# Patient Record
Sex: Male | Born: 2000
Health system: Southern US, Community
[De-identification: ages and names within clinical notes are randomized; demographics above are authoritative.]

---
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
## 2020-01-02 DIAGNOSIS — Z03818 Encounter for observation for suspected exposure to other biological agents ruled out: Secondary | ICD-10-CM | POA: Diagnosis not present

## 2020-01-09 DIAGNOSIS — Z03818 Encounter for observation for suspected exposure to other biological agents ruled out: Secondary | ICD-10-CM | POA: Diagnosis not present

## 2020-03-10 ENCOUNTER — Other Ambulatory Visit (HOSPITAL_COMMUNITY): Payer: Self-pay | Admitting: Dermatology

## 2020-03-10 MED FILL — MOMETASONE FUROATE 0.1% OIN: 0.1 | 30 days supply | Qty: 60 | Fill #0

## 2020-04-21 DIAGNOSIS — Z7182 Exercise counseling: Secondary | ICD-10-CM | POA: Diagnosis not present

## 2020-04-21 DIAGNOSIS — Z68.41 Body mass index (BMI) pediatric, 5th percentile to less than 85th percentile for age: Secondary | ICD-10-CM | POA: Diagnosis not present

## 2020-04-21 DIAGNOSIS — Z0000 Encounter for general adult medical examination without abnormal findings: Secondary | ICD-10-CM | POA: Diagnosis not present

## 2020-04-21 DIAGNOSIS — Z713 Dietary counseling and surveillance: Secondary | ICD-10-CM | POA: Diagnosis not present

## 2020-04-21 DIAGNOSIS — Z23 Encounter for immunization: Secondary | ICD-10-CM | POA: Diagnosis not present

## 2020-04-21 DIAGNOSIS — Z113 Encounter for screening for infections with a predominantly sexual mode of transmission: Secondary | ICD-10-CM | POA: Diagnosis not present

## 2020-08-13 MED FILL — MOMETASONE FUROATE 0.1% OIN: 0.1 | 30 days supply | Qty: 60 | Fill #0

## 2020-08-31 DIAGNOSIS — Z03818 Encounter for observation for suspected exposure to other biological agents ruled out: Secondary | ICD-10-CM | POA: Diagnosis not present

## 2020-10-24 ENCOUNTER — Other Ambulatory Visit: Payer: Self-pay

## 2020-10-24 DIAGNOSIS — Z20822 Contact with and (suspected) exposure to covid-19: Secondary | ICD-10-CM

## 2020-10-29 LAB — NOVEL CORONAVIRUS, NAA

## 2021-09-16 ENCOUNTER — Ambulatory Visit (INDEPENDENT_AMBULATORY_CARE_PROVIDER_SITE_OTHER): Payer: Self-pay

## 2021-09-16 ENCOUNTER — Other Ambulatory Visit: Payer: Self-pay

## 2021-09-16 ENCOUNTER — Ambulatory Visit (INDEPENDENT_AMBULATORY_CARE_PROVIDER_SITE_OTHER): Payer: 59 | Admitting: Podiatry

## 2021-09-16 ENCOUNTER — Encounter: Payer: Self-pay | Admitting: Podiatry

## 2021-09-16 DIAGNOSIS — M7661 Achilles tendinitis, right leg: Secondary | ICD-10-CM

## 2021-09-16 DIAGNOSIS — S99921A Unspecified injury of right foot, initial encounter: Secondary | ICD-10-CM | POA: Diagnosis not present

## 2021-09-16 MED ORDER — MELOXICAM 15 MG PO TABS: 15.0000 mg | ORAL_TABLET | Freq: Every day | ORAL | 0 refills | Status: AC

## 2021-09-16 NOTE — Progress Notes (Signed)
  Subjective:  Patient ID: Douglas Thompson, male    DOB: [birth year],   MRN: [id number]  No chief complaint on file.   20 y.o. male presents for right heel pain that started about a month ago. Was playing in a football game. 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 Relates he didn't have a specific injury but started hurting after the game. Relates it is radiating to his toes. Has been stretching it with the trainer but continued to have pain . Denies any other pedal complaints. Denies n/v/f/c.   No past medical history on file.  Objective:  Physical Exam: Vascular: DP/PT pulses 2/4 bilateral. CFT <3 seconds. Normal hair growth on digits. No edema.  Skin. No lacerations or abrasions bilateral feet.   Musculoskeletal: MMT 5/5 bilateral lower extremities in DF, PF, Inversion and Eversion. Deceased ROM in DF of ankle joint. Tender to achilles insertion on the right. No tenderness proximally along the tendon. Pain with plantarflexion and dorsiflexion.  Neurological: Sensation intact to light touch.   Assessment:   1. Pain in right foot      Plan:  Patient was evaluated and treated and all questions answered. -Xrays reviewed. No acute fractures dislocations or spurring  -Discussed Achilles insertional tendonitis and treatment options with patient.  -Discussed stretching exercises. -Rx Meloxicam provided  -Heel lifts provided and discussed proper shoewear.  -Discussed if no improvement will consider MRI/PT/EPAT/PRP injections.  -Patient to return to office as needed or sooner if condition worsens.   Lorenda Peck, DPM

## 2021-09-16 NOTE — Patient Instructions (Signed)

## 2021-10-29 ENCOUNTER — Other Ambulatory Visit: Payer: Self-pay

## 2021-10-29 ENCOUNTER — Ambulatory Visit (INDEPENDENT_AMBULATORY_CARE_PROVIDER_SITE_OTHER): Payer: 59 | Admitting: Podiatry

## 2021-10-29 ENCOUNTER — Encounter: Payer: Self-pay | Admitting: Podiatry

## 2021-10-29 ENCOUNTER — Ambulatory Visit: Payer: 59 | Admitting: Podiatry

## 2021-10-29 DIAGNOSIS — M7661 Achilles tendinitis, right leg: Secondary | ICD-10-CM | POA: Diagnosis not present

## 2021-10-29 NOTE — Progress Notes (Signed)
°  Subjective:  Patient ID: Douglas Thompson, male    DOB: 09-Aug-2001,   MRN: [id number]  Chief Complaint  Patient presents with   Foot Problem    Pt is here to f/u right heel pain . Mention that heel feels much better      21 y.o. male presents for follow-up of right achilles tendon pain. States he is doing about 1 -90% better with his pain. Relates he just got back to school and will be starting workouts soon.  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  Denies any other pedal complaints. Denies n/v/f/c.   History reviewed. No pertinent past medical history.  Objective:  Physical Exam: Vascular: DP/PT pulses 2/4 bilateral. CFT <3 seconds. Normal hair growth on digits. No edema.  Skin. No lacerations or abrasions bilateral feet.   Musculoskeletal: MMT 5/5 bilateral lower extremities in DF, PF, Inversion and Eversion. Deceased ROM in DF of ankle joint. Tender to achilles insertion on the right. No tenderness proximally along the tendon. Pain with plantarflexion and dorsiflexion.  Neurological: Sensation intact to light touch.   Assessment:   1. Tendonitis, Achilles, right       Plan:  Patient was evaluated and treated and all questions answered. -Xrays reviewed. No acute fractures dislocations or spurring  -Discussed Achilles insertional tendonitis and treatment options with patient.  -Continue stretching exercises especially before workouts.  -Rx Meloxicam provided as needed.  -Heel lifts as needed -Patient to return to office as needed or sooner if condition worsens.   Lorenda Peck, DPM

## 2022-03-17 DIAGNOSIS — M545 Low back pain, unspecified: Secondary | ICD-10-CM | POA: Diagnosis not present

## 2022-03-22 DIAGNOSIS — M545 Low back pain, unspecified: Secondary | ICD-10-CM | POA: Diagnosis not present

## 2022-03-24 DIAGNOSIS — M545 Low back pain, unspecified: Secondary | ICD-10-CM | POA: Diagnosis not present

## 2022-03-28 DIAGNOSIS — M545 Low back pain, unspecified: Secondary | ICD-10-CM | POA: Diagnosis not present

## 2022-04-12 DIAGNOSIS — M545 Low back pain, unspecified: Secondary | ICD-10-CM | POA: Diagnosis not present

## 2022-04-25 DIAGNOSIS — M5459 Other low back pain: Secondary | ICD-10-CM | POA: Diagnosis not present

## 2022-05-26 DIAGNOSIS — S83282A Other tear of lateral meniscus, current injury, left knee, initial encounter: Secondary | ICD-10-CM | POA: Diagnosis not present

## 2022-05-26 DIAGNOSIS — M25562 Pain in left knee: Secondary | ICD-10-CM | POA: Diagnosis not present

## 2023-02-10 IMAGING — DX DG FOOT COMPLETE 3+V*R*
3 series · 3 of 3 positions shown · non-contrast
Comparison: None.

CLINICAL DATA: Foot injury 1 month ago while playing football,
initial encounter

EXAM:
RIGHT FOOT COMPLETE - 3+ VIEW

[foot ap]
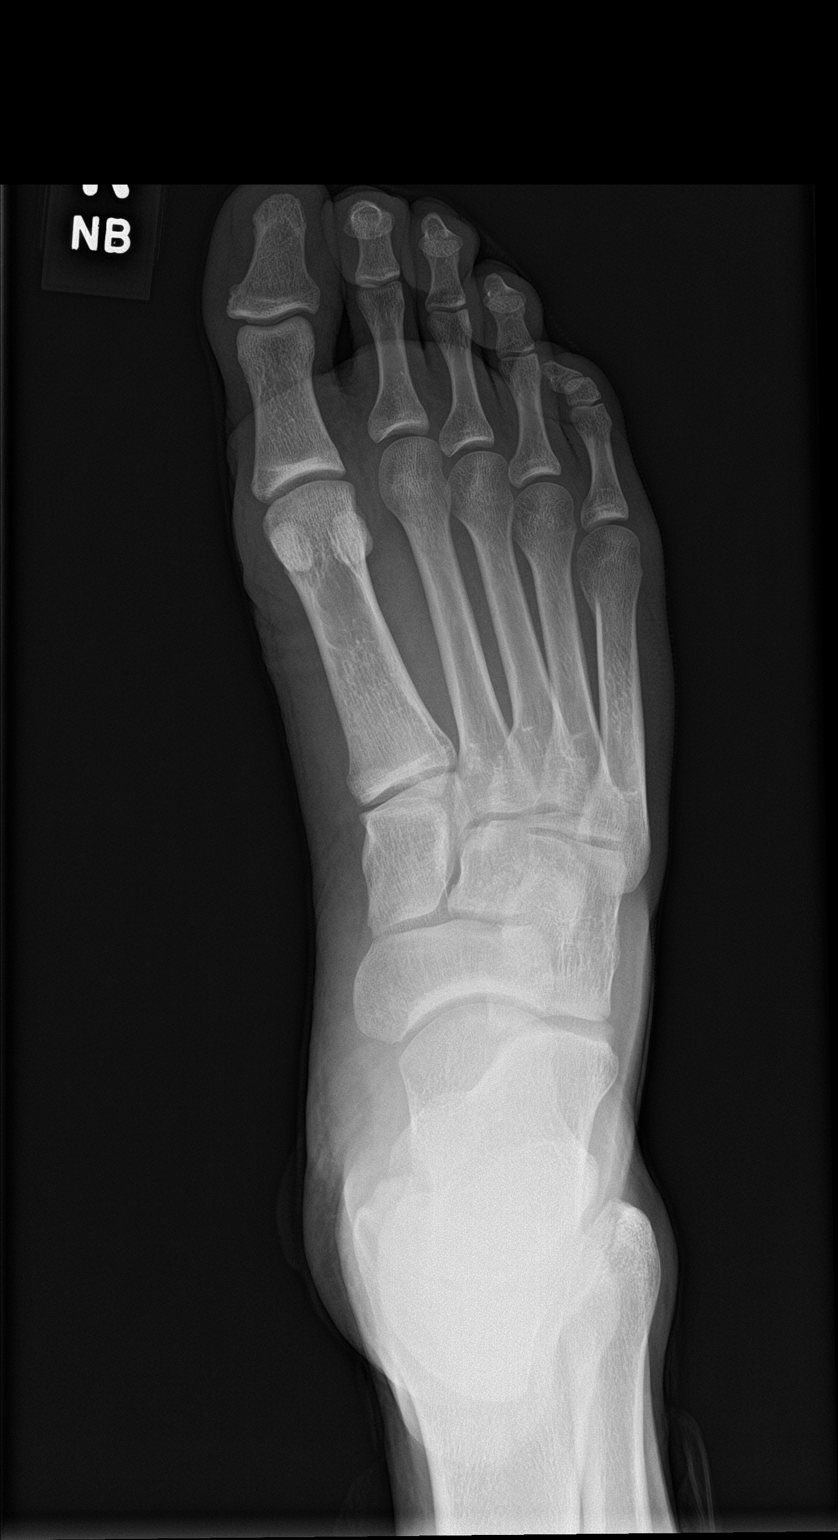

[foot obl]
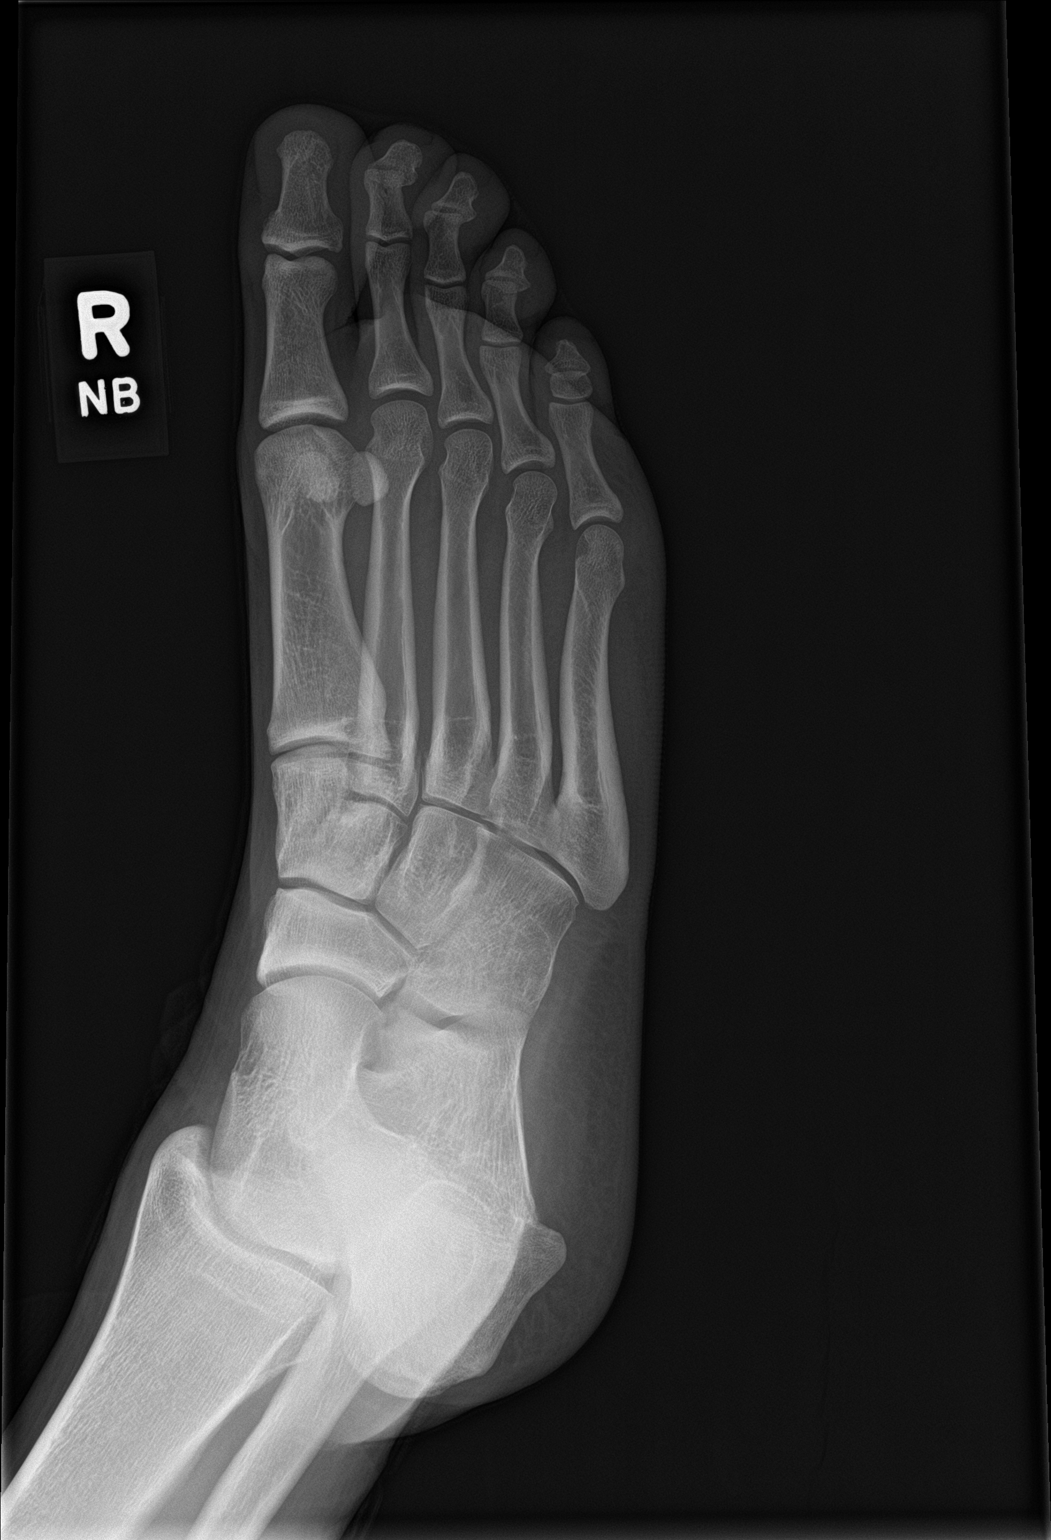

[foot lat]
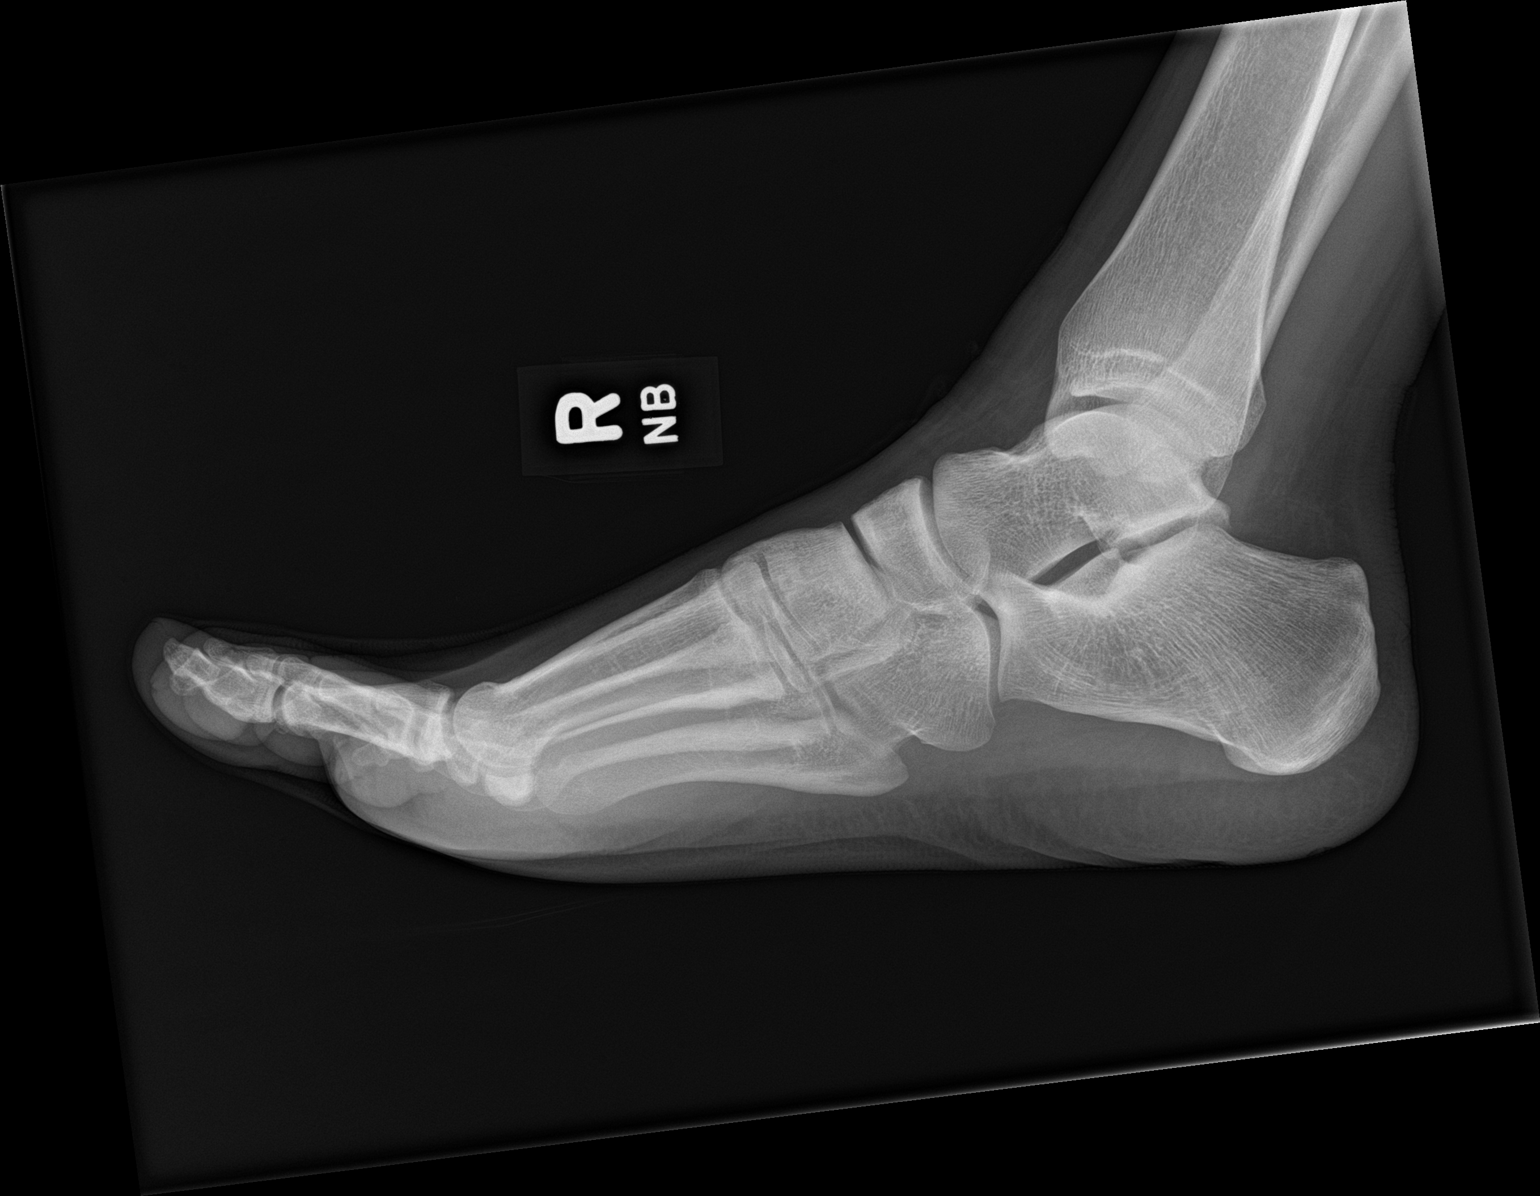

[3 of 3 positions shown; findings below may reference images not displayed]

FINDINGS: There is no evidence of fracture or dislocation. There is no
evidence of arthropathy or other focal bone abnormality. Soft
tissues are unremarkable.
IMPRESSION: No acute abnormality noted.

## 2023-03-10 ENCOUNTER — Ambulatory Visit (INDEPENDENT_AMBULATORY_CARE_PROVIDER_SITE_OTHER): Payer: Commercial Managed Care - PPO

## 2023-03-10 ENCOUNTER — Ambulatory Visit (HOSPITAL_BASED_OUTPATIENT_CLINIC_OR_DEPARTMENT_OTHER): Payer: Commercial Managed Care - PPO | Admitting: Orthopaedic Surgery

## 2023-03-10 DIAGNOSIS — M25511 Pain in right shoulder: Secondary | ICD-10-CM

## 2023-03-10 DIAGNOSIS — S43431A Superior glenoid labrum lesion of right shoulder, initial encounter: Secondary | ICD-10-CM

## 2023-03-10 NOTE — Progress Notes (Signed)
Chief Complaint: Right shoulder pain     History of Present Illness:    Douglas Thompson is a 22 y.o. male with right shoulder pain after a football injury where he went low to tackle the patient and subsequently felt pain in the right shoulder.  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  Since that time he has had a hard time with overhead activities or with benchpress.  He is persistently experiencing pain in the posterior aspect of the shoulder.  Has not had any injections or physical therapy    Surgical History:   None  PMH/PSH/Family History/Social History/Meds/Allergies:   No past medical history on file. No past surgical history on file. Social History   Socioeconomic History   Marital status: Single    Spouse name: Not on file   Number of children: Not on file   Years of education: Not on file   Highest education level: Not on file  Occupational History   Not on file  Tobacco Use   Smoking status: Not on file   Smokeless tobacco: Not on file  Substance and Sexual Activity   Alcohol use: Not on file   Drug use: Not on file   Sexual activity: Not on file  Other Topics Concern   Not on file  Social History Narrative   Not on file   Social Determinants of Health   Financial Resource Strain: Not on file  Food Insecurity: Not on file  Transportation Needs: Not on file  Physical Activity: Not on file  Stress: Not on file  Social Connections: Not on file   No family history on file. Allergies  Allergen Reactions   Amoxicillin Other (See Comments)   Current Outpatient Medications  Medication Sig Dispense Refill   meloxicam (MOBIC) 15 MG tablet Take 1 tablet (15 mg total) by mouth daily. 30 tablet 0   No current facility-administered medications for this visit.   No results found.  Review of Systems:   A ROS was performed including pertinent positives and negatives as documented in the  HPI.  Physical Exam :   Constitutional: NAD and appears stated age Neurological: Alert and oriented Psych: Appropriate affect and cooperative There were no vitals taken for this visit.   Comprehensive Musculoskeletal Exam:    Musculoskeletal Exam    Inspection Right Left  Skin No atrophy or winging No atrophy or winging  Palpation    Tenderness Posterior GH noe  Range of Motion    Flexion (passive) 170 170  Flexion (active) 170 170  Abduction 170 170  ER at the side 70 70  Can reach behind back to T12 T12  Strength     5/5 5/5  Special Tests    Pseudoparalytic No No  Neurologic    Fires PIN, radial, median, ulnar, musculocutaneous, axillary, suprascapular, long thoracic, and spinal accessory innervated muscles. No abnormal sensibility  Vascular/Lymphatic    Radial Pulse 2+ 2+  Cervical Exam    Patient has symmetric cervical range of motion with negative Spurling's test.  Special Test: Positive jerk maneuver     Imaging:   Xray (3 views right shoulder): Normal    I personally reviewed and interpreted the radiographs.   Assessment:   22 y.o. male with evidence of right  shoulder glenohumeral posterior instability after football injury.  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  I did discuss that I would like to get him going with physical therapy and rehab to strengthen up his posterior chain in his posterior rotator cuff musculature in an effort to help stabilize this injury.  I will plan to begin this and we will see him back following the MRI to discuss results  Plan :    -Plan for MRI right shoulder and follow-up discuss results     I personally saw and evaluated the patient, and participated in the management and treatment plan.  Huel Cote, MD Attending Physician, Orthopedic Surgery  This document was dictated using Dragon voice  recognition software. A reasonable attempt at proof reading has been made to minimize errors.

## 2023-03-23 ENCOUNTER — Ambulatory Visit (HOSPITAL_BASED_OUTPATIENT_CLINIC_OR_DEPARTMENT_OTHER): Payer: Commercial Managed Care - PPO | Admitting: Orthopaedic Surgery

## 2023-03-24 ENCOUNTER — Ambulatory Visit (HOSPITAL_COMMUNITY)
Admission: RE | Admit: 2023-03-24 | Discharge: 2023-03-24 | Disposition: A | Discharge status: Home / Self Care | Payer: Commercial Managed Care - PPO | Source: Ambulatory Visit | Attending: Orthopaedic Surgery | Admitting: Orthopaedic Surgery

## 2023-03-24 DIAGNOSIS — S43431A Superior glenoid labrum lesion of right shoulder, initial encounter: Secondary | ICD-10-CM

## 2023-03-24 DIAGNOSIS — M25511 Pain in right shoulder: Secondary | ICD-10-CM | POA: Insufficient documentation

## 2023-03-24 DIAGNOSIS — Y9361 Activity, american tackle football: Secondary | ICD-10-CM | POA: Diagnosis not present

## 2023-03-24 MED ORDER — IOHEXOL 180 MG/ML  SOLN
10.0000 mL | Freq: Once | INTRAMUSCULAR | Status: AC | PRN
  Administered 2023-03-24: 10 mL via INTRAVENOUS

## 2023-03-24 MED ORDER — GADOBUTROL 1 MMOL/ML IV SOLN
1.0000 mL | Freq: Once | INTRAVENOUS | Status: AC | PRN
  Administered 2023-03-24: 1 mL

## 2023-03-24 MED ORDER — LIDOCAINE HCL (PF) 1 % IJ SOLN
5.0000 mL | Freq: Once | INTRAMUSCULAR | Status: AC
  Administered 2023-03-24: 5 mL via INTRADERMAL

## 2023-03-24 MED ORDER — SODIUM CHLORIDE (PF) 0.9 % IJ SOLN: 10.0000 mL | INTRAMUSCULAR | Status: DC | PRN

## 2023-03-24 NOTE — Procedures (Signed)
PROCEDURE SUMMARY:  Successful fluoroscopic guided right shoulder arthrogram.  No immediate complications.  Pt tolerated well.   EBL = <1 ml  Please see full dictation in imaging section of Epic for procedure details.   Alex Gardener, AGNP-BC 03/24/2023, 11:42 AM

## 2023-03-29 ENCOUNTER — Ambulatory Visit (HOSPITAL_BASED_OUTPATIENT_CLINIC_OR_DEPARTMENT_OTHER): Payer: Commercial Managed Care - PPO | Admitting: Orthopaedic Surgery

## 2023-04-05 ENCOUNTER — Ambulatory Visit (HOSPITAL_BASED_OUTPATIENT_CLINIC_OR_DEPARTMENT_OTHER): Payer: Commercial Managed Care - PPO | Admitting: Orthopaedic Surgery

## 2023-04-05 DIAGNOSIS — S43431A Superior glenoid labrum lesion of right shoulder, initial encounter: Secondary | ICD-10-CM | POA: Diagnosis not present

## 2023-04-05 DIAGNOSIS — M25511 Pain in right shoulder: Secondary | ICD-10-CM | POA: Diagnosis not present

## 2023-04-05 NOTE — Progress Notes (Signed)
Chief Complaint: Right shoulder pain     History of Present Illness:   04/05/2023: Presents today for follow-up of his right shoulder. He is here for MRI review.  Denies that the shoulder is giving out deep joint based pain that is 3-4/10  Douglas Thompson is a 22 y.o. male with right shoulder pain after a football injury where he went low to tackle the patient and subsequently felt pain in the right shoulder.  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  Since that time he has had a hard time with overhead activities or with benchpress.  He is persistently experiencing pain in the posterior aspect of the shoulder.  Has not had any injections or physical therapy    Surgical History:   None  PMH/PSH/Family History/Social History/Meds/Allergies:   No past medical history on file. No past surgical history on file. Social History   Socioeconomic History   Marital status: Single    Spouse name: Not on file   Number of children: Not on file   Years of education: Not on file   Highest education level: Not on file  Occupational History   Not on file  Tobacco Use   Smoking status: Not on file   Smokeless tobacco: Not on file  Substance and Sexual Activity   Alcohol use: Not on file   Drug use: Not on file   Sexual activity: Not on file  Other Topics Concern   Not on file  Social History Narrative   Not on file   Social Determinants of Health   Financial Resource Strain: Not on file  Food Insecurity: Not on file  Transportation Needs: Not on file  Physical Activity: Not on file  Stress: Not on file  Social Connections: Not on file   No family history on file. Allergies  Allergen Reactions   Amoxicillin Other (See Comments)   Current Outpatient Medications  Medication Sig Dispense Refill   meloxicam (MOBIC) 15 MG tablet Take 1 tablet (15 mg total) by mouth daily. 30 tablet 0   No current  facility-administered medications for this visit.   No results found.  Review of Systems:   A ROS was performed including pertinent positives and negatives as documented in the HPI.  Physical Exam :   Constitutional: NAD and appears stated age Neurological: Alert and oriented Psych: Appropriate affect and cooperative There were no vitals taken for this visit.   Comprehensive Musculoskeletal Exam:    Musculoskeletal Exam    Inspection Right Left  Skin No atrophy or winging No atrophy or winging  Palpation    Tenderness Posterior GH noe  Range of Motion    Flexion (passive) 170 170  Flexion (active) 170 170  Abduction 170 170  ER at the side 70 70  Can reach behind back to T12 T12  Strength     5/5 5/5  Special Tests    Pseudoparalytic No No  Neurologic    Fires PIN, radial, median, ulnar, musculocutaneous, axillary, suprascapular, long thoracic, and spinal accessory innervated muscles. No abnormal sensibility  Vascular/Lymphatic    Radial Pulse 2+ 2+  Cervical Exam    Patient has symmetric cervical range of motion with negative Spurling's test.  Special Test: Positive jerk maneuver     Imaging:  Xray (3 views right shoulder): Normal  MRI right shoulder: Status post inferior labral lesion with loose body in the axillary pouch  I personally reviewed and interpreted the radiographs.   Assessment:   22 y.o. male with evidence of right shoulder glenohumeral posterior instability after football injury.  Overall I discussed that his MRI is consistent with a posterior inferior labral tear.  Given the fact that he is approaching the beginning of the season he is quite concerned about any type of intervention as I do believe that this would ultimately delay his season.  We did discuss the return to play timeline of a minimum of 4 months.  Given this he would like to delay any type of intervention.  I will plan to follow back up with him in mid to late September.  In the  meantime we will plan to get physical therapy going for a posterior rotator cuff strengthening program to assist with his posterior instability  Plan :    -Plan physical therapy for posterior chain strengthening of the shoulder     I personally saw and evaluated the patient, and participated in the management and treatment plan.  Huel Cote, MD Attending Physician, Orthopedic Surgery  This document was dictated using Dragon voice recognition software. A reasonable attempt at proof reading has been made to minimize errors.

## 2023-04-07 ENCOUNTER — Ambulatory Visit (HOSPITAL_BASED_OUTPATIENT_CLINIC_OR_DEPARTMENT_OTHER): Payer: Commercial Managed Care - PPO | Attending: Orthopaedic Surgery | Admitting: Physical Therapy

## 2023-04-07 ENCOUNTER — Other Ambulatory Visit: Payer: Self-pay

## 2023-04-07 ENCOUNTER — Encounter (HOSPITAL_BASED_OUTPATIENT_CLINIC_OR_DEPARTMENT_OTHER): Payer: Self-pay | Admitting: Physical Therapy

## 2023-04-07 DIAGNOSIS — M25511 Pain in right shoulder: Secondary | ICD-10-CM | POA: Insufficient documentation

## 2023-04-07 DIAGNOSIS — M6281 Muscle weakness (generalized): Secondary | ICD-10-CM | POA: Diagnosis not present

## 2023-04-07 DIAGNOSIS — S43431A Superior glenoid labrum lesion of right shoulder, initial encounter: Secondary | ICD-10-CM | POA: Diagnosis not present

## 2023-04-07 NOTE — Therapy (Signed)
OUTPATIENT PHYSICAL THERAPY UPPER EXTREMITY EVALUATION   Patient Name: Douglas Thompson MRN: [id number] DOB:11-12-2000, 22 y.o., male Today's Date: 04/07/2023  END OF SESSION:  PT End of Session - 04/07/23 1155     Visit Number 1    Number of Visits 21    Date for PT Re-Evaluation 07/06/23    Authorization Type [facility]    PT Start Time [phone]    PT Stop Time 0930    PT Time Calculation (min) 45 min    Activity Tolerance Patient tolerated treatment well    Behavior During Therapy [facility] for tasks assessed/performed             History reviewed. No pertinent past medical history. History reviewed. No pertinent surgical history. There are no problems to display for this patient.   PCP: N/A  REFERRING PROVIDER: Huel Cote, MD   REFERRING DIAG:  [phone] (ICD-10-CM) - Tear of right glenoid labrum, initial encounter      THERAPY DIAG:  Muscle weakness (generalized)  Right shoulder pain, unspecified chronicity  Rationale for Evaluation and Treatment: Rehabilitation  ONSET DATE: May 2024  SUBJECTIVE:                                                                                                                                                                                      SUBJECTIVE STATEMENT:  Injury occurred during a spring scrimmage. Pt states that the right shoulder pain started after a football injury after making a tackle. Shoulder was abducted and dipped fwd into IR.  Pt states it felt loose after after he realxed and showered that night. Could not sleep on it due to sharp pain inside the joint. 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  Since that time pt has pain with overhead activities and benchpress. Benching felt shakey and weak but no longer. Does not feel unstable. Will get aggravated with throwing but then pain will settle down after an hour. Can do OHP but has mild pain after. Denies popping clicking. Pt denies NT. Pt can touch the pain  superficially. Reaching across the chest feels stiff but no stiffness otherwise going OH.    Pt is currently out for the summer and will return to training camp starting in August 3rd.    Prior to injury: 210-215 bench for 5x5; 185-200 (has not tried bench)  Hand dominance: Right  PERTINENT HISTORY: N/A   PAIN:  Are you having pain? Yes: NPRS scale: 0/10; 5/10 Pain location: posterior hsoulder  Pain description: dull, achiness Aggravating factors: IR/ER, bench, throwing, benching, reaching backwards, sleeping on it  Relieving factors: resting by the side, ice/heat, ibuprofen   PRECAUTIONS:  Shoulder  WEIGHT BEARING RESTRICTIONS: No  FALLS:  Has patient fallen in last 6 months? No  LIVING ENVIRONMENT: Lives with: lives with their family Lives in: House/apartment  OCCUPATION: Engineer, site; Land  PLOF: Independent  PATIENT GOALS: return to football    OBJECTIVE:   DIAGNOSTIC FINDINGS:  IMPRESSION: 1. 0.8 by 0.4 by 1.1 cm filling defect posteriorly in the glenohumeral joint, suspicious for a free chondral fragment. Focal chondral thinning and irregularity in the anterior and anterior inferior glenoid is suspicious for potential donor site. 2. Questionable small GLAD tear on ABER image 14 series 12 immediately adjacent to the chondral defect. 3. Mesoacromial os acromiale  PATIENT SURVEYS :  FOTO 72 82 @ DC 2 pts MCII  COGNITION: Overall cognitive status: Within functional limits for tasks assessed     SENSATION: WFL  POSTURE: Rounded shoulders, elevated R in standing  UPPER EXTREMITY ROM: Full ROM of bilateral UE, painful 90/90 IR on R- into infra/posterior cuff  UPPER EXTREMITY MMT:  MMT Right eval Left eval  Shoulder flexion 66.4 60.1  Shoulder extension 62.7 69.9  Shoulder abduction 57.1 61.3  Shoulder adduction    Shoulder internal rotation 33.1 31.7  Shoulder external rotation 23.9 (90/090) 41.6 (90/090)  (Blank rows = not  tested)  SHOULDER SPECIAL TESTS: Impingement tests: Hawkins/Kennedy impingement test: negative and Painful arc test: negative SLAP lesions: Biceps load test: negative Instability tests: Load and shift test: negative Rotator cuff assessment: Empty can test: negative, Full can test: negative, External rotation lag sign: negative, and Belly press test: negative   JOINT MOBILITY TESTING:  Expected stiffness into posterior glide of R shoulder; globally stiff through both GHJ  PALPATION:  TTP of R posterior cuff, especially infra   TODAY'S TREATMENT:                                                                                                                                         DATE: 6/21   Program Notes Tennis self release ( flexion and IR/ER) 10x with  Exercises - Full Plank with Scapular Protraction Retraction AROM  - 1 x daily - 3 x weekly - 3 sets - 10 reps - Kettlebell Drag  - 1 x daily - 3 x weekly - 3 sets - 12 reps - Kettlebell Bottom Up Carry  - 1 x daily - 3 x weekly - 1 sets - 3 reps - 7ft hold  PATIENT EDUCATION: Education details: MOI, diagnosis, prognosis, anatomy, exercise progression, DOMS expectations, muscle firing,  envelope of function, HEP, POC  Person educated: Patient Education method: Explanation, Demonstration, Tactile cues, Verbal cues, and Handouts Education comprehension: verbalized understanding, returned demonstration, verbal cues required, tactile cues required, and needs further education  HOME EXERCISE PROGRAM:  Access Code: [email or web address] URL: [email or web address] Date: 04/07/2023 Prepared by: Zebedee Iba  ASSESSMENT:  CLINICAL IMPRESSION:  Patient is a 22 y.o. male who was  seen today for physical therapy evaluation and treatment for c/c of R shoulder pain. Pt's s/s appear consistent with R posterior shoulder muscular injury and soft tissue restriction due to known traumatic MOI. Imaging does show GLAD tear and chondral defect of  the R GHJ. However, pt's pain is minimally sensitive and irritable with movement. Pt likely to respond well to focus on stability and strength based loading of R posterior shoulder.  Plan to continue with R posterior shoulder pain management modalities PRN and focused posterior shoulder and mid back strengthening at future sessions. Pt would benefit from continued skilled therapy in order to reach goals and maximize functional R UE strength and ROM for full return to PLOF as a Land.      OBJECTIVE IMPAIRMENTS: decreased strength, hypomobility, increased muscle spasms, impaired flexibility, impaired UE functional use, improper body mechanics, postural dysfunction, and pain.    ACTIVITY LIMITATIONS: carrying, lifting, dressing, reach over head, and hygiene/grooming   PARTICIPATION LIMITATIONS: cleaning, laundry, interpersonal relationship, shopping, community activity, occupation, yard work, and exercise   PERSONAL FACTORS: Fitness, Time since onset of injury/illness/exacerbation, are also affecting patient's functional outcome.    REHAB POTENTIAL: Good   CLINICAL DECISION MAKING: Stable/uncomplicated   EVALUATION COMPLEXITY: Low   GOALS:  SHORT TERM GOALS: Target date: 05/19/2023    Will be compliant with appropriate progressive HEP  Baseline: Goal status: INITIAL   2.  Pt will be able to demonstrate ability to perform full OH and rotational AROM without pain in order to demonstrate functional improvement in UE function for self-care and house hold duties.  Baseline:  Goal status: INITIAL   3.  Pt will score at least 2 pt increase on FOTO to demonstrate functional improvement in MCII and pt perceived function.   Baseline:  Goal status: INITIAL        LONG TERM GOALS: Target date: 06/30/2023     Pt  will become independent with final HEP in order to demonstrate synthesis of PT education.    Goal status: INITIAL   2.  Pt will be able to demonstrate symmetrical HHD  testing of R vs L shoulder strength in order to demonstrate functional improvement in LE function for return to normal exercise and student athlete demands.      Goal status: INITIAL   3.  Pt will be able to demonstrate/report continuous throwing during practice without pain in order to demonstrate functional improvement in UE function for return to team play/practice.     Goal status: INITIAL   4.  Pt will be able to demonstrate ability to push/pull/press with the R UE without pain in order to demonstrate functional improvement in UE function for return to exercise.  Goal status: INITIAL     PLAN: PT FREQUENCY: 1x/week   PT DURATION: 12 weeks    PLANNED INTERVENTIONS: Therapeutic exercises, Therapeutic activity, Neuromuscular re-education, Balance training, Gait training, Patient/Family education, Self Care, Joint mobilization, Joint manipulation, Aquatic Therapy, Dry Needling, Electrical stimulation, Wheelchair mobility training, Spinal manipulation, Spinal mobilization, Moist heat, scar mobilization, Splintting, Taping, Vasopneumatic device, Traction, Ultrasound, Ionotophoresis 4mg /ml Dexamethasone, Manual therapy, and Re-evaluation   PLAN FOR NEXT SESSION: R shoulder strength, scapular stability, TPDN and manual for R posterior shoulder, CKC loaded stability, bench and OHP technique checks       Zebedee Iba, PT 04/07/2023, 12:15 PM

## 2023-04-10 ENCOUNTER — Ambulatory Visit (HOSPITAL_BASED_OUTPATIENT_CLINIC_OR_DEPARTMENT_OTHER): Payer: Commercial Managed Care - PPO | Admitting: Physical Therapy

## 2023-04-10 ENCOUNTER — Telehealth (HOSPITAL_BASED_OUTPATIENT_CLINIC_OR_DEPARTMENT_OTHER): Payer: Self-pay | Admitting: Physical Therapy

## 2023-04-10 NOTE — Telephone Encounter (Signed)
Left VM. Called pt in regard to 1st N/S visit and to refer to attendance policy. Informed of next scheduled appt.   Zebedee Iba PT, DPT 04/10/23 8:27 AM

## 2023-04-18 ENCOUNTER — Ambulatory Visit (HOSPITAL_BASED_OUTPATIENT_CLINIC_OR_DEPARTMENT_OTHER): Payer: Commercial Managed Care - PPO | Attending: Orthopaedic Surgery | Admitting: Physical Therapy

## 2023-04-18 ENCOUNTER — Encounter (HOSPITAL_BASED_OUTPATIENT_CLINIC_OR_DEPARTMENT_OTHER): Payer: Self-pay | Admitting: Physical Therapy

## 2023-04-18 DIAGNOSIS — M25511 Pain in right shoulder: Secondary | ICD-10-CM | POA: Insufficient documentation

## 2023-04-18 DIAGNOSIS — M6281 Muscle weakness (generalized): Secondary | ICD-10-CM | POA: Insufficient documentation

## 2023-04-18 NOTE — Therapy (Signed)
OUTPATIENT PHYSICAL THERAPY UPPER EXTREMITY TREATMENT   Patient Name: Douglas Thompson MRN: [id number] DOB:January 29, 2001, 22 y.o., male Today's Date: 04/18/2023  END OF SESSION:  PT End of Session - 04/18/23 1612     Visit Number 2    Number of Visits 21    Date for PT Re-Evaluation 07/06/23    Authorization Type [facility]    PT Start Time 1615    PT Stop Time 1655    PT Time Calculation (min) 40 min    Activity Tolerance Patient tolerated treatment well    Behavior During Therapy [facility] for tasks assessed/performed             History reviewed. No pertinent past medical history. History reviewed. No pertinent surgical history. There are no problems to display for this patient.   PCP: N/A  REFERRING PROVIDER: Huel Cote, MD   REFERRING DIAG:  [phone] (ICD-10-CM) - Tear of right glenoid labrum, initial encounter      THERAPY DIAG:  Right shoulder pain, unspecified chronicity  Muscle weakness (generalized)  Rationale for Evaluation and Treatment: Rehabilitation  ONSET DATE: May 2024  SUBJECTIVE:                                                                                                                                                                                      SUBJECTIVE STATEMENT:  Pt reports that the tennis ball has helped reduced tenderness across the back of the shoulder. He was able to workout yesterday with the team with split jerks, bench press, and shrugs without increase in pain.   Eval:   Injury occurred during a spring scrimmage. Pt states that the right shoulder pain started after a football injury after making a tackle. Shoulder was abducted and dipped fwd into IR.  Pt states it felt loose after after he realxed and showered that night. Could not sleep on it due to sharp pain inside the joint. 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  Since that time pt has pain with overhead activities and benchpress. Benching felt shakey and weak but  no longer. Does not feel unstable. Will get aggravated with throwing but then pain will settle down after an hour. Can do OHP but has mild pain after. Denies popping clicking. Pt denies NT. Pt can touch the pain superficially. Reaching across the chest feels stiff but no stiffness otherwise going OH.    Pt is currently out for the summer and will return to training camp starting in August 3rd.    Prior to injury: 210-215 bench for 5x5; 185-200 (has not tried bench)  Hand dominance: Right  PERTINENT HISTORY: N/A   PAIN:  Are you having pain? Yes: NPRS scale: 0/10; 5/10 Pain location: posterior hsoulder  Pain description: dull, achiness Aggravating factors: IR/ER, bench, throwing, benching, reaching backwards, sleeping on it  Relieving factors: resting by the side, ice/heat, ibuprofen   PRECAUTIONS: Shoulder  WEIGHT BEARING RESTRICTIONS: No  FALLS:  Has patient fallen in last 6 months? No  LIVING ENVIRONMENT: Lives with: lives with their family Lives in: House/apartment  OCCUPATION: Engineer, site; Land  PLOF: Independent  PATIENT GOALS: return to football    OBJECTIVE:   DIAGNOSTIC FINDINGS:  IMPRESSION: 1. 0.8 by 0.4 by 1.1 cm filling defect posteriorly in the glenohumeral joint, suspicious for a free chondral fragment. Focal chondral thinning and irregularity in the anterior and anterior inferior glenoid is suspicious for potential donor site. 2. Questionable small GLAD tear on ABER image 14 series 12 immediately adjacent to the chondral defect. 3. Mesoacromial os acromiale  PATIENT SURVEYS :  FOTO 72 82 @ DC 2 pts MCII   UPPER EXTREMITY ROM: Full ROM of bilateral UE, painful 90/90 IR on R- into infra/posterior cuff  UPPER EXTREMITY MMT:  MMT Right eval Left eval  Shoulder flexion 66.4 60.1  Shoulder extension 62.7 69.9  Shoulder abduction 57.1 61.3  Shoulder adduction    Shoulder internal rotation 33.1 31.7  Shoulder external  rotation 23.9 (90/090) 41.6 (90/090)  (Blank rows = not tested)  TODAY'S TREATMENT:                                                                                                                                         DATE:    7/2  UBE retro and fwd 2 min each  Bench technique Clean pull technique   13lb KB drags 3x10 Tall plank with rotation reach 2x10 Bottoms up rack hold 37ft 10lbs 3x   6/21   Program Notes Tennis self release ( flexion and IR/ER) 10x with  Exercises - Full Plank with Scapular Protraction Retraction AROM  - 1 x daily - 3 x weekly - 3 sets - 10 reps - Kettlebell Drag  - 1 x daily - 3 x weekly - 3 sets - 12 reps - Kettlebell Bottom Up Carry  - 1 x daily - 3 x weekly - 1 sets - 3 reps - 65ft hold  PATIENT EDUCATION: Education details: MOI, diagnosis, prognosis, anatomy, exercise progression, DOMS expectations, muscle firing,  envelope of function, HEP, POC  Person educated: Patient Education method: Explanation, Demonstration, Tactile cues, Verbal cues, and Handouts Education comprehension: verbalized understanding, returned demonstration, verbal cues required, tactile cues required, and needs further education  HOME EXERCISE PROGRAM:  Access Code: [email or web address] URL: [email or web address] Date: 04/07/2023 Prepared by: Zebedee Iba  ASSESSMENT:  CLINICAL IMPRESSION:  Pt able to progress intensity of scapular stability in addition to dynamic CKC without pain. Pt able to modify bench and clean technique for safe performance with team lifts and no  exacerbation of pain. Pt advised to still avoid OH movements, especially at speed. HEP updated today. Plan to continue with dynamic strength as muscle endurance in open pack position is still challenging at this time. Pt had report of decreased stiffness following exercise and joint mobilizations. Pt would benefit from continued skilled therapy in order to reach goals and maximize functional R UE strength  and ROM for full return to PLOF as a Land.      OBJECTIVE IMPAIRMENTS: decreased strength, hypomobility, increased muscle spasms, impaired flexibility, impaired UE functional use, improper body mechanics, postural dysfunction, and pain.    ACTIVITY LIMITATIONS: carrying, lifting, dressing, reach over head, and hygiene/grooming   PARTICIPATION LIMITATIONS: cleaning, laundry, interpersonal relationship, shopping, community activity, occupation, yard work, and exercise   PERSONAL FACTORS: Fitness, Time since onset of injury/illness/exacerbation, are also affecting patient's functional outcome.    REHAB POTENTIAL: Good   CLINICAL DECISION MAKING: Stable/uncomplicated   EVALUATION COMPLEXITY: Low   GOALS:  SHORT TERM GOALS: Target date: 05/19/2023    Will be compliant with appropriate progressive HEP  Baseline: Goal status: INITIAL   2.  Pt will be able to demonstrate ability to perform full OH and rotational AROM without pain in order to demonstrate functional improvement in UE function for self-care and house hold duties.  Baseline:  Goal status: INITIAL   3.  Pt will score at least 2 pt increase on FOTO to demonstrate functional improvement in MCII and pt perceived function.   Baseline:  Goal status: INITIAL        LONG TERM GOALS: Target date: 06/30/2023     Pt  will become independent with final HEP in order to demonstrate synthesis of PT education.    Goal status: INITIAL   2.  Pt will be able to demonstrate symmetrical HHD testing of R vs L shoulder strength in order to demonstrate functional improvement in LE function for return to normal exercise and student athlete demands.      Goal status: INITIAL   3.  Pt will be able to demonstrate/report continuous throwing during practice without pain in order to demonstrate functional improvement in UE function for return to team play/practice.     Goal status: INITIAL   4.  Pt will be able to demonstrate  ability to push/pull/press with the R UE without pain in order to demonstrate functional improvement in UE function for return to exercise.  Goal status: INITIAL     PLAN: PT FREQUENCY: 1x/week   PT DURATION: 12 weeks    PLANNED INTERVENTIONS: Therapeutic exercises, Therapeutic activity, Neuromuscular re-education, Balance training, Gait training, Patient/Family education, Self Care, Joint mobilization, Joint manipulation, Aquatic Therapy, Dry Needling, Electrical stimulation, Wheelchair mobility training, Spinal manipulation, Spinal mobilization, Moist heat, scar mobilization, Splintting, Taping, Vasopneumatic device, Traction, Ultrasound, Ionotophoresis 4mg /ml Dexamethasone, Manual therapy, and Re-evaluation   PLAN FOR NEXT SESSION: R shoulder strength, scapular stability, TPDN and manual for R posterior shoulder, CKC loaded stability       Zebedee Iba, PT 04/18/2023, 5:01 PM

## 2023-04-24 ENCOUNTER — Ambulatory Visit (HOSPITAL_BASED_OUTPATIENT_CLINIC_OR_DEPARTMENT_OTHER): Payer: Commercial Managed Care - PPO | Admitting: Physical Therapy

## 2023-04-24 ENCOUNTER — Encounter (HOSPITAL_BASED_OUTPATIENT_CLINIC_OR_DEPARTMENT_OTHER): Payer: Self-pay | Admitting: Physical Therapy

## 2023-04-24 DIAGNOSIS — M25511 Pain in right shoulder: Secondary | ICD-10-CM | POA: Diagnosis not present

## 2023-04-24 DIAGNOSIS — M6281 Muscle weakness (generalized): Secondary | ICD-10-CM

## 2023-04-24 NOTE — Therapy (Signed)
OUTPATIENT PHYSICAL THERAPY UPPER EXTREMITY TREATMENT   Patient Name: Douglas Thompson MRN: [id number] DOB:2001/04/02, 22 y.o., male Today's Date: 04/24/2023  END OF SESSION:  PT End of Session - 04/24/23 0855     Visit Number 3    Number of Visits 21    Date for PT Re-Evaluation 07/06/23    Authorization Type [facility]    PT Start Time [phone]   late   PT Stop Time 0925    PT Time Calculation (min) 30 min    Activity Tolerance Patient tolerated treatment well    Behavior During Therapy [facility] for tasks assessed/performed             History reviewed. No pertinent past medical history. History reviewed. No pertinent surgical history. There are no problems to display for this patient.   PCP: N/A  REFERRING PROVIDER: Huel Cote, MD   REFERRING DIAG:  [phone] (ICD-10-CM) - Tear of right glenoid labrum, initial encounter      THERAPY DIAG:  Right shoulder pain, unspecified chronicity  Muscle weakness (generalized)  Rationale for Evaluation and Treatment: Rehabilitation  ONSET DATE: May 2024  SUBJECTIVE:                                                                                                                                                                                      SUBJECTIVE STATEMENT:  Pt states he was able to work out Friday and Saturday without pain. Has avoided OH lifts. No pain with anything.   Eval:   Injury occurred during a spring scrimmage. Pt states that the right shoulder pain started after a football injury after making a tackle. Shoulder was abducted and dipped fwd into IR.  Pt states it felt loose after after he realxed and showered that night. Could not sleep on it due to sharp pain inside the joint. 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  Since that time pt has pain with overhead activities and benchpress. Benching felt shakey and weak but no longer. Does not feel unstable. Will get aggravated with throwing but then pain will  settle down after an hour. Can do OHP but has mild pain after. Denies popping clicking. Pt denies NT. Pt can touch the pain superficially. Reaching across the chest feels stiff but no stiffness otherwise going OH.    Pt is currently out for the summer and will return to training camp starting in August 3rd.    Prior to injury: 210-215 bench for 5x5; 185-200 (has not tried bench)  Hand dominance: Right  PERTINENT HISTORY: N/A   PAIN:  Are you having pain? Yes: NPRS scale: 0/10; 5/10 Pain location: posterior hsoulder  Pain description: dull, achiness Aggravating factors: IR/ER, bench, throwing, benching, reaching backwards, sleeping on it  Relieving factors: resting by the side, ice/heat, ibuprofen   PRECAUTIONS: Shoulder  WEIGHT BEARING RESTRICTIONS: No  FALLS:  Has patient fallen in last 6 months? No  LIVING ENVIRONMENT: Lives with: lives with their family Lives in: House/apartment  OCCUPATION: Engineer, site; Land  PLOF: Independent  PATIENT GOALS: return to football    OBJECTIVE:   DIAGNOSTIC FINDINGS:  IMPRESSION: 1. 0.8 by 0.4 by 1.1 cm filling defect posteriorly in the glenohumeral joint, suspicious for a free chondral fragment. Focal chondral thinning and irregularity in the anterior and anterior inferior glenoid is suspicious for potential donor site. 2. Questionable small GLAD tear on ABER image 14 series 12 immediately adjacent to the chondral defect. 3. Mesoacromial os acromiale  PATIENT SURVEYS :  FOTO 72 82 @ DC 2 pts MCII   UPPER EXTREMITY ROM: Full ROM of bilateral UE, painful 90/90 IR on R- into infra/posterior cuff  UPPER EXTREMITY MMT:  MMT Right eval Left eval  Shoulder flexion 66.4 60.1  Shoulder extension 62.7 69.9  Shoulder abduction 57.1 61.3  Shoulder adduction    Shoulder internal rotation 33.1 31.7  Shoulder external rotation 23.9 (90/090) 41.6 (90/090)  (Blank rows = not tested)  TODAY'S TREATMENT:                                                                                                                                          DATE:    7/8  UBE retro and fwd 2 min each  Banded lat stretch with R bias 10s 4x Posterior banded shoulder stretch 10s 4x  Land mine press 25lbs with bar 3x10 (warm up sets prior) [employer] jerk 25lb 2x6 Landmine elbow abducted row 5x5 25lbs with bar  OH 10lb ball dribble 20s 3x Kneeling fall to catch 3x5   7/2  UBE retro and fwd 2 min each  Bench technique Clean pull technique   13lb KB drags 3x10 Tall plank with rotation reach 2x10 Bottoms up rack hold 39ft 10lbs 3x   6/21   Program Notes Tennis self release ( flexion and IR/ER) 10x with  Exercises - Full Plank with Scapular Protraction Retraction AROM  - 1 x daily - 3 x weekly - 3 sets - 10 reps - Kettlebell Drag  - 1 x daily - 3 x weekly - 3 sets - 12 reps - Kettlebell Bottom Up Carry  - 1 x daily - 3 x weekly - 1 sets - 3 reps - 74ft hold  PATIENT EDUCATION: Education details: MOI, diagnosis, prognosis, anatomy, exercise progression, DOMS expectations, muscle firing,  envelope of function, HEP, POC  Person educated: Patient Education method: Explanation, Demonstration, Tactile cues, Verbal cues, and Handouts Education comprehension: verbalized understanding, returned demonstration, verbal cues required, tactile cues required, and needs further education  HOME EXERCISE PROGRAM:  Access Code: [email or web address] URL: [email or web address] Date: 04/07/2023 Prepared by: Zebedee Iba  ASSESSMENT:  CLINICAL IMPRESSION:  Session limited by late arrival time.   Pt able to progress to modified OH position as he is able to lift with his team below shoulder level without pain. Pt with fatigue, especially into shoulder abducted position highlighting posterior cuff and deltoid strength deficits. Pt HEP updated to include landmine press variation and kneeling catch and land for team lifts  and OH endurance strength. Consider addition of cleans at next session as catching and landing mechanics are good from kneeling position without pain. Pt would benefit from continued skilled therapy in order to reach goals and maximize functional R UE strength and ROM for full return to PLOF as a Land.      OBJECTIVE IMPAIRMENTS: decreased strength, hypomobility, increased muscle spasms, impaired flexibility, impaired UE functional use, improper body mechanics, postural dysfunction, and pain.    ACTIVITY LIMITATIONS: carrying, lifting, dressing, reach over head, and hygiene/grooming   PARTICIPATION LIMITATIONS: cleaning, laundry, interpersonal relationship, shopping, community activity, occupation, yard work, and exercise   PERSONAL FACTORS: Fitness, Time since onset of injury/illness/exacerbation, are also affecting patient's functional outcome.    REHAB POTENTIAL: Good   CLINICAL DECISION MAKING: Stable/uncomplicated   EVALUATION COMPLEXITY: Low   GOALS:  SHORT TERM GOALS: Target date: 05/19/2023    Will be compliant with appropriate progressive HEP  Baseline: Goal status: INITIAL   2.  Pt will be able to demonstrate ability to perform full OH and rotational AROM without pain in order to demonstrate functional improvement in UE function for self-care and house hold duties.  Baseline:  Goal status: INITIAL   3.  Pt will score at least 2 pt increase on FOTO to demonstrate functional improvement in MCII and pt perceived function.   Baseline:  Goal status: INITIAL        LONG TERM GOALS: Target date: 06/30/2023     Pt  will become independent with final HEP in order to demonstrate synthesis of PT education.    Goal status: INITIAL   2.  Pt will be able to demonstrate symmetrical HHD testing of R vs L shoulder strength in order to demonstrate functional improvement in LE function for return to normal exercise and student athlete demands.      Goal status: INITIAL    3.  Pt will be able to demonstrate/report continuous throwing during practice without pain in order to demonstrate functional improvement in UE function for return to team play/practice.     Goal status: INITIAL   4.  Pt will be able to demonstrate ability to push/pull/press with the R UE without pain in order to demonstrate functional improvement in UE function for return to exercise.  Goal status: INITIAL     PLAN: PT FREQUENCY: 1x/week   PT DURATION: 12 weeks    PLANNED INTERVENTIONS: Therapeutic exercises, Therapeutic activity, Neuromuscular re-education, Balance training, Gait training, Patient/Family education, Self Care, Joint mobilization, Joint manipulation, Aquatic Therapy, Dry Needling, Electrical stimulation, Wheelchair mobility training, Spinal manipulation, Spinal mobilization, Moist heat, scar mobilization, Splintting, Taping, Vasopneumatic device, Traction, Ultrasound, Ionotophoresis 4mg /ml Dexamethasone, Manual therapy, and Re-evaluation   PLAN FOR NEXT SESSION: R shoulder strength, scapular stability, TPDN and manual for R posterior shoulder, CKC loaded stability       Zebedee Iba, PT 04/24/2023, 9:31 AM

## 2023-04-27 ENCOUNTER — Encounter (HOSPITAL_BASED_OUTPATIENT_CLINIC_OR_DEPARTMENT_OTHER): Payer: Self-pay | Admitting: Physical Therapy

## 2023-04-27 ENCOUNTER — Ambulatory Visit (HOSPITAL_BASED_OUTPATIENT_CLINIC_OR_DEPARTMENT_OTHER): Payer: Commercial Managed Care - PPO | Admitting: Physical Therapy

## 2023-04-27 DIAGNOSIS — M25511 Pain in right shoulder: Secondary | ICD-10-CM | POA: Diagnosis not present

## 2023-04-27 DIAGNOSIS — M6281 Muscle weakness (generalized): Secondary | ICD-10-CM

## 2023-04-27 NOTE — Therapy (Signed)
OUTPATIENT PHYSICAL THERAPY UPPER EXTREMITY TREATMENT   Patient Name: Douglas Thompson MRN: [id number] DOB:[birth year], 22 y.o., male Today's Date: 04/27/2023  END OF SESSION:  PT End of Session - 04/27/23 0847     Visit Number 4    Number of Visits 21    Date for PT Re-Evaluation 07/06/23    Authorization Type [facility]    PT Start Time [phone]    PT Stop Time 0926    PT Time Calculation (min) 39 min    Activity Tolerance Patient tolerated treatment well    Behavior During Therapy [facility] for tasks assessed/performed             History reviewed. No pertinent past medical history. History reviewed. No pertinent surgical history. There are no problems to display for this patient.   PCP: N/A  REFERRING PROVIDER: Huel Cote, MD   REFERRING DIAG:  [phone] (ICD-10-CM) - Tear of right glenoid labrum, initial encounter      THERAPY DIAG:  Right shoulder pain, unspecified chronicity  Muscle weakness (generalized)  Rationale for Evaluation and Treatment: Rehabilitation  ONSET DATE: May 2024  SUBJECTIVE:                                                                                                                                                                                      SUBJECTIVE STATEMENT:  Pt was able to floor press and OH DB press with team lifts without pain. Pt still has pain with sleeping on that R side.   Eval:   Injury occurred during a spring scrimmage. Pt states that the right shoulder pain started after a football injury after making a tackle. Shoulder was abducted and dipped fwd into IR.  Pt states it felt loose after after he realxed and showered that night. Could not sleep on it due to sharp pain inside the joint. Pt plays safety at [employer]  Since that time pt has pain with overhead activities and benchpress. Benching felt shakey and weak but no longer. Does not feel unstable. Will get aggravated with throwing but then pain will  settle down after an hour. Can do OHP but has mild pain after. Denies popping clicking. Pt denies NT. Pt can touch the pain superficially. Reaching across the chest feels stiff but no stiffness otherwise going OH.    Pt is currently out for the summer and will return to training camp starting in August 3rd.    Prior to injury: 210-215 bench for 5x5; 185-200 (has not tried bench)  Hand dominance: Right  PERTINENT HISTORY: N/A   PAIN:  Are you having pain? No NPRS scale: 0/10; 5/10 Pain location:  posterior hsoulder  Pain description: dull, achiness Aggravating factors: IR/ER, bench, throwing, benching, reaching backwards, sleeping on it  Relieving factors: resting by the side, ice/heat, ibuprofen   PRECAUTIONS: Shoulder  WEIGHT BEARING RESTRICTIONS: No  FALLS:  Has patient fallen in last 6 months? No  LIVING ENVIRONMENT: Lives with: lives with their family Lives in: House/apartment  OCCUPATION: Engineer, site; Land  PLOF: Independent  PATIENT GOALS: return to football    OBJECTIVE:   DIAGNOSTIC FINDINGS:  IMPRESSION: 1. 0.8 by 0.4 by 1.1 cm filling defect posteriorly in the glenohumeral joint, suspicious for a free chondral fragment. Focal chondral thinning and irregularity in the anterior and anterior inferior glenoid is suspicious for potential donor site. 2. Questionable small GLAD tear on ABER image 14 series 12 immediately adjacent to the chondral defect. 3. Mesoacromial os acromiale  PATIENT SURVEYS :  FOTO 72 82 @ DC 2 pts MCII   UPPER EXTREMITY ROM: Full ROM of bilateral UE, painful 90/90 IR on R- into infra/posterior cuff  UPPER EXTREMITY MMT:  MMT Right eval Left eval  Shoulder flexion 66.4 60.1  Shoulder extension 62.7 69.9  Shoulder abduction 57.1 61.3  Shoulder adduction    Shoulder internal rotation 33.1 31.7  Shoulder external rotation 23.9 (90/090) 41.6 (90/090)  (Blank rows = not tested)  TODAY'S TREATMENT:                                                                                                                                          DATE:    7/11  UBE retro and fwd 2 min each  STM R infra and mid trap R GHJ inf and post glide grade IV   Banded self inf GHJ glide R 10x 3s  Posterior banded shoulder stretch 10s 4x   D1/D2 flex and ext manual resisted  3x10 of each Clean technique analysis  5x4 cleans 95lbs   7/8  UBE retro and fwd 2 min each  Banded lat stretch with R bias 10s 4x Posterior banded shoulder stretch 10s 4x  Land mine press 25lbs with bar 3x10 (warm up sets prior) [employer] jerk 25lb 2x6 Landmine elbow abducted row 5x5 25lbs with bar  OH 10lb ball dribble 20s 3x Kneeling fall to catch 3x5   7/2  UBE retro and fwd 2 min each  Bench technique Clean pull technique   13lb KB drags 3x10 Tall plank with rotation reach 2x10 Bottoms up rack hold 3ft 10lbs 3x   6/21   Program Notes Tennis self release ( flexion and IR/ER) 10x with  Exercises - Full Plank with Scapular Protraction Retraction AROM  - 1 x daily - 3 x weekly - 3 sets - 10 reps - Kettlebell Drag  - 1 x daily - 3 x weekly - 3 sets - 12 reps - Kettlebell Bottom Up Carry  - 1 x daily - 3 x  weekly - 1 sets - 3 reps - 31ft hold  PATIENT EDUCATION: Education details: MOI, diagnosis, prognosis, anatomy, exercise progression, DOMS expectations, muscle firing,  envelope of function, HEP, POC  Person educated: Patient Education method: Explanation, Demonstration, Tactile cues, Verbal cues, and Handouts Education comprehension: verbalized understanding, returned demonstration, verbal cues required, tactile cues required, and needs further education  HOME EXERCISE PROGRAM:  Access Code: [email or web address] URL: [email or web address] Date: 04/07/2023 Prepared by: Zebedee Iba  ASSESSMENT:  CLINICAL IMPRESSION:  Pt with report of posterior shouldre discomfort decreasing to 4/10 in 90/90 IR position  following joint mobilization and STM to posterior shoulder. Pt given edu about self mobilization position as well as progression into lifting with team. Pt was able to demo good technique and no pain with lightweight hang cleans with catch today. Discomfort noted with drop/catch position in gym setting. Plan to progress towards OH power work at next and [facility] stability holds with perturbations. Pt would benefit from continued skilled therapy in order to reach goals and maximize functional R UE strength and ROM for full return to PLOF as a Land.      OBJECTIVE IMPAIRMENTS: decreased strength, hypomobility, increased muscle spasms, impaired flexibility, impaired UE functional use, improper body mechanics, postural dysfunction, and pain.    ACTIVITY LIMITATIONS: carrying, lifting, dressing, reach over head, and hygiene/grooming   PARTICIPATION LIMITATIONS: cleaning, laundry, interpersonal relationship, shopping, community activity, occupation, yard work, and exercise   PERSONAL FACTORS: Fitness, Time since onset of injury/illness/exacerbation, are also affecting patient's functional outcome.    REHAB POTENTIAL: Good   CLINICAL DECISION MAKING: Stable/uncomplicated   EVALUATION COMPLEXITY: Low   GOALS:  SHORT TERM GOALS: Target date: 05/19/2023    Will be compliant with appropriate progressive HEP  Baseline: Goal status: INITIAL   2.  Pt will be able to demonstrate ability to perform full OH and rotational AROM without pain in order to demonstrate functional improvement in UE function for self-care and house hold duties.  Baseline:  Goal status: INITIAL   3.  Pt will score at least 2 pt increase on FOTO to demonstrate functional improvement in MCII and pt perceived function.   Baseline:  Goal status: INITIAL        LONG TERM GOALS: Target date: 06/30/2023     Pt  will become independent with final HEP in order to demonstrate synthesis of PT education.    Goal status:  INITIAL   2.  Pt will be able to demonstrate symmetrical HHD testing of R vs L shoulder strength in order to demonstrate functional improvement in LE function for return to normal exercise and student athlete demands.      Goal status: INITIAL   3.  Pt will be able to demonstrate/report continuous throwing during practice without pain in order to demonstrate functional improvement in UE function for return to team play/practice.     Goal status: INITIAL   4.  Pt will be able to demonstrate ability to push/pull/press with the R UE without pain in order to demonstrate functional improvement in UE function for return to exercise.  Goal status: INITIAL     PLAN: PT FREQUENCY: 1x/week   PT DURATION: 12 weeks    PLANNED INTERVENTIONS: Therapeutic exercises, Therapeutic activity, Neuromuscular re-education, Balance training, Gait training, Patient/Family education, Self Care, Joint mobilization, Joint manipulation, Aquatic Therapy, Dry Needling, Electrical stimulation, Wheelchair mobility training, Spinal manipulation, Spinal mobilization, Moist heat, scar mobilization, Splintting, Taping, Vasopneumatic device, Traction, Ultrasound, Ionotophoresis 4mg /ml Dexamethasone, Manual therapy, and Re-evaluation  PLAN FOR NEXT SESSION: R shoulder strength, scapular stability, TPDN and manual for R posterior shoulder, CKC loaded stability       Zebedee Iba, PT 04/27/2023, 9:31 AM

## 2023-05-01 ENCOUNTER — Encounter (HOSPITAL_BASED_OUTPATIENT_CLINIC_OR_DEPARTMENT_OTHER): Payer: Self-pay | Admitting: Physical Therapy

## 2023-05-01 ENCOUNTER — Ambulatory Visit (HOSPITAL_BASED_OUTPATIENT_CLINIC_OR_DEPARTMENT_OTHER): Payer: Commercial Managed Care - PPO | Admitting: Physical Therapy

## 2023-05-01 DIAGNOSIS — M6281 Muscle weakness (generalized): Secondary | ICD-10-CM | POA: Diagnosis not present

## 2023-05-01 DIAGNOSIS — M25511 Pain in right shoulder: Secondary | ICD-10-CM

## 2023-05-01 NOTE — Therapy (Signed)
OUTPATIENT PHYSICAL THERAPY UPPER EXTREMITY TREATMENT   Patient Name: Douglas Thompson MRN: [id number] DOB:Jan 02, 2001, 22 y.o., male Today's Date: 05/01/2023  END OF SESSION:  PT End of Session - 05/01/23 0806     Visit Number 4    Number of Visits 21    Date for PT Re-Evaluation 07/06/23    Authorization Type [facility]    PT Start Time 0804    PT Stop Time 0843    PT Time Calculation (min) 39 min    Activity Tolerance Patient tolerated treatment well    Behavior During Therapy [facility] for tasks assessed/performed              History reviewed. No pertinent past medical history. History reviewed. No pertinent surgical history. There are no problems to display for this patient.   PCP: N/A  REFERRING PROVIDER: Huel Cote, MD   REFERRING DIAG:  [phone] (ICD-10-CM) - Tear of right glenoid labrum, initial encounter      THERAPY DIAG:  Right shoulder pain, unspecified chronicity  Muscle weakness (generalized)  Rationale for Evaluation and Treatment: Rehabilitation  ONSET DATE: May 2024  SUBJECTIVE:                                                                                                                                                                                      SUBJECTIVE STATEMENT:  Pt states he was able to do a modified UE workout with the team without pain. He has not tried to do a full bench press yet or full OH press yet. Pt tnotes OH mobility is tight on the R. IR feels better and is not as noticeable.   Eval:   Injury occurred during a spring scrimmage. Pt states that the right shoulder pain started after a football injury after making a tackle. Shoulder was abducted and dipped fwd into IR.  Pt states it felt loose after after he realxed and showered that night. Could not sleep on it due to sharp pain inside the joint. 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  Since that time pt has pain with overhead activities and benchpress. Benching felt  shakey and weak but no longer. Does not feel unstable. Will get aggravated with throwing but then pain will settle down after an hour. Can do OHP but has mild pain after. Denies popping clicking. Pt denies NT. Pt can touch the pain superficially. Reaching across the chest feels stiff but no stiffness otherwise going OH.    Pt is currently out for the summer and will return to training camp starting in August 3rd.    Prior to injury: 210-215 bench for 5x5; 185-200 (has not  tried bench)  Hand dominance: Right  PERTINENT HISTORY: N/A   PAIN:  Are you having pain? No NPRS scale: 0/10; 5/10 Pain location: posterior hsoulder  Pain description: dull, achiness Aggravating factors: IR/ER, bench, throwing, benching, reaching backwards, sleeping on it  Relieving factors: resting by the side, ice/heat, ibuprofen   PRECAUTIONS: Shoulder  WEIGHT BEARING RESTRICTIONS: No  FALLS:  Has patient fallen in last 6 months? No  LIVING ENVIRONMENT: Lives with: lives with their family Lives in: House/apartment  OCCUPATION: Engineer, site; Land  PLOF: Independent  PATIENT GOALS: return to football    OBJECTIVE:   DIAGNOSTIC FINDINGS:  IMPRESSION: 1. 0.8 by 0.4 by 1.1 cm filling defect posteriorly in the glenohumeral joint, suspicious for a free chondral fragment. Focal chondral thinning and irregularity in the anterior and anterior inferior glenoid is suspicious for potential donor site. 2. Questionable small GLAD tear on ABER image 14 series 12 immediately adjacent to the chondral defect. 3. Mesoacromial os acromiale  PATIENT SURVEYS :  FOTO 72 82 @ DC 2 pts MCII   UPPER EXTREMITY ROM: Full ROM of bilateral UE, painful 90/90 IR on R- into infra/posterior cuff  UPPER EXTREMITY MMT:  MMT Right eval Left eval  Shoulder flexion 66.4 60.1  Shoulder extension 62.7 69.9  Shoulder abduction 57.1 61.3  Shoulder adduction    Shoulder internal rotation 33.1 31.7   Shoulder external rotation 23.9 (90/090) 41.6 (90/090)  (Blank rows = not tested)  TODAY'S TREATMENT:                                                                                                                                         DATE:    7/15  UBE retro and fwd 2 min each  STM R infra and rear delt R GHJ inf and post glide grade IV   Self active release teres group with foam roller IR/ER 3 positions (10x each)  Dynamic pec stretch with rotation 2s hold 15x   Flat bench working up to 73% of 1RM (175lbs) 3x3  DB arnold press RPE 7 3x12 (25lbs each hand)  7/11  UBE retro and fwd 2 min each  STM R infra and mid trap R GHJ inf and post glide grade IV   Banded self inf GHJ glide R 10x 3s  Posterior banded shoulder stretch 10s 4x   D1/D2 flex and ext manual resisted  3x10 of each Clean technique analysis  5x4 cleans 95lbs   7/8  UBE retro and fwd 2 min each  Banded lat stretch with R bias 10s 4x Posterior banded shoulder stretch 10s 4x  Land mine press 25lbs with bar 3x10 (warm up sets prior) [employer] jerk 25lb 2x6 Landmine elbow abducted row 5x5 25lbs with bar  OH 10lb ball dribble 20s 3x Kneeling fall to catch 3x5   7/2  UBE retro and fwd 2 min each  Bench technique Clean pull technique   13lb KB drags 3x10 Tall plank with rotation reach 2x10 Bottoms up rack hold 29ft 10lbs 3x   6/21   Program Notes Tennis self release ( flexion and IR/ER) 10x with  Exercises - Full Plank with Scapular Protraction Retraction AROM  - 1 x daily - 3 x weekly - 3 sets - 10 reps - Kettlebell Drag  - 1 x daily - 3 x weekly - 3 sets - 12 reps - Kettlebell Bottom Up Carry  - 1 x daily - 3 x weekly - 1 sets - 3 reps - 41ft hold  PATIENT EDUCATION: Education details: MOI, diagnosis, prognosis, anatomy, exercise progression, DOMS expectations, muscle firing,  envelope of function, HEP, POC  Person educated: Patient Education method: Explanation, Demonstration,  Tactile cues, Verbal cues, and Handouts Education comprehension: verbalized understanding, returned demonstration, verbal cues required, tactile cues required, and needs further education  HOME EXERCISE PROGRAM:  Access Code: [email or web address] URL: [email or web address] Date: 04/07/2023 Prepared by: Zebedee Iba  ASSESSMENT:  CLINICAL IMPRESSION:  Pt able to progress with loading of the R shoulder with clearance to start bench pressing and OH pressing in the weight room. Pt given DB movements to start with OH motions in order to isolate R vs L UE. Pt without pain during session with ~70% 1RM with barbell benching. Plan to adding explosive catch land as well as progress single UE stability in CKC. Pt would benefit from continued skilled therapy in order to reach goals and maximize functional R UE strength and ROM for full return to PLOF as a Land.      OBJECTIVE IMPAIRMENTS: decreased strength, hypomobility, increased muscle spasms, impaired flexibility, impaired UE functional use, improper body mechanics, postural dysfunction, and pain.    ACTIVITY LIMITATIONS: carrying, lifting, dressing, reach over head, and hygiene/grooming   PARTICIPATION LIMITATIONS: cleaning, laundry, interpersonal relationship, shopping, community activity, occupation, yard work, and exercise   PERSONAL FACTORS: Fitness, Time since onset of injury/illness/exacerbation, are also affecting patient's functional outcome.    REHAB POTENTIAL: Good   CLINICAL DECISION MAKING: Stable/uncomplicated   EVALUATION COMPLEXITY: Low   GOALS:  SHORT TERM GOALS: Target date: 05/19/2023    Will be compliant with appropriate progressive HEP  Baseline: Goal status: INITIAL   2.  Pt will be able to demonstrate ability to perform full OH and rotational AROM without pain in order to demonstrate functional improvement in UE function for self-care and house hold duties.  Baseline:  Goal status: INITIAL   3.  Pt  will score at least 2 pt increase on FOTO to demonstrate functional improvement in MCII and pt perceived function.   Baseline:  Goal status: INITIAL        LONG TERM GOALS: Target date: 06/30/2023     Pt  will become independent with final HEP in order to demonstrate synthesis of PT education.    Goal status: INITIAL   2.  Pt will be able to demonstrate symmetrical HHD testing of R vs L shoulder strength in order to demonstrate functional improvement in LE function for return to normal exercise and student athlete demands.      Goal status: INITIAL   3.  Pt will be able to demonstrate/report continuous throwing during practice without pain in order to demonstrate functional improvement in UE function for return to team play/practice.     Goal status: INITIAL   4.  Pt will be able to demonstrate ability to push/pull/press with the R UE without  pain in order to demonstrate functional improvement in UE function for return to exercise.  Goal status: INITIAL     PLAN: PT FREQUENCY: 1x/week   PT DURATION: 12 weeks    PLANNED INTERVENTIONS: Therapeutic exercises, Therapeutic activity, Neuromuscular re-education, Balance training, Gait training, Patient/Family education, Self Care, Joint mobilization, Joint manipulation, Aquatic Therapy, Dry Needling, Electrical stimulation, Wheelchair mobility training, Spinal manipulation, Spinal mobilization, Moist heat, scar mobilization, Splintting, Taping, Vasopneumatic device, Traction, Ultrasound, Ionotophoresis 4mg /ml Dexamethasone, Manual therapy, and Re-evaluation   PLAN FOR NEXT SESSION: R shoulder strength, scapular stability, TPDN and manual for R posterior shoulder, CKC loaded stability       Zebedee Iba, PT 05/01/2023, 8:44 AM

## 2023-05-04 ENCOUNTER — Ambulatory Visit (HOSPITAL_BASED_OUTPATIENT_CLINIC_OR_DEPARTMENT_OTHER): Payer: Commercial Managed Care - PPO | Admitting: Physical Therapy

## 2023-05-04 ENCOUNTER — Encounter (HOSPITAL_BASED_OUTPATIENT_CLINIC_OR_DEPARTMENT_OTHER): Payer: Self-pay | Admitting: Physical Therapy

## 2023-05-04 DIAGNOSIS — M6281 Muscle weakness (generalized): Secondary | ICD-10-CM

## 2023-05-04 DIAGNOSIS — M25511 Pain in right shoulder: Secondary | ICD-10-CM

## 2023-05-04 NOTE — Therapy (Signed)
OUTPATIENT PHYSICAL THERAPY UPPER EXTREMITY TREATMENT   Patient Name: Douglas Thompson MRN: [id number] DOB:[birth year], 22 y.o., male Today's Date: 05/04/2023  END OF SESSION:  PT End of Session - 05/04/23 0803     Visit Number 6    Number of Visits 21    Date for PT Re-Evaluation 07/06/23    Authorization Type [facility]    PT Start Time 0801    PT Stop Time 0840    PT Time Calculation (min) 39 min    Activity Tolerance Patient tolerated treatment well    Behavior During Therapy [facility] for tasks assessed/performed               History reviewed. No pertinent past medical history. History reviewed. No pertinent surgical history. There are no problems to display for this patient.   PCP: N/A  REFERRING PROVIDER: Huel Cote, MD   REFERRING DIAG:  [phone] (ICD-10-CM) - Tear of right glenoid labrum, initial encounter      THERAPY DIAG:  Right shoulder pain, unspecified chronicity  Muscle weakness (generalized)  Rationale for Evaluation and Treatment: Rehabilitation  ONSET DATE: May 2024  SUBJECTIVE:                                                                                                                                                                                      SUBJECTIVE STATEMENT:  Pt states he was able to do UE lifting with pull ups without issue. He was able to run/sprint without issue. No pain in the shoulder. Feels like stiffness.   Eval:   Injury occurred during a spring scrimmage. Pt states that the right shoulder pain started after a football injury after making a tackle. Shoulder was abducted and dipped fwd into IR.  Pt states it felt loose after after he realxed and showered that night. Could not sleep on it due to sharp pain inside the joint. Pt plays safety at [employer].  Since that time pt has pain with overhead activities and benchpress. Benching felt shakey and weak but no longer. Does not feel unstable. Will get  aggravated with throwing but then pain will settle down after an hour. Can do OHP but has mild pain after. Denies popping clicking. Pt denies NT. Pt can touch the pain superficially. Reaching across the chest feels stiff but no stiffness otherwise going OH.    Pt is currently out for the summer and will return to training camp starting in August 3rd.    Prior to injury: 210-215 bench for 5x5; 185-200 (has not tried bench)  Hand dominance: Right  PERTINENT HISTORY: N/A   PAIN:  Are you having pain? No  NPRS scale: 0/10; 5/10 Pain location: posterior hsoulder  Pain description: dull, achiness Aggravating factors: IR/ER, bench, throwing, benching, reaching backwards, sleeping on it  Relieving factors: resting by the side, ice/heat, ibuprofen   PRECAUTIONS: Shoulder  WEIGHT BEARING RESTRICTIONS: No  FALLS:  Has patient fallen in last 6 months? No  LIVING ENVIRONMENT: Lives with: lives with their family Lives in: House/apartment  OCCUPATION: Engineer, site; Land  PLOF: Independent  PATIENT GOALS: return to football    OBJECTIVE:   DIAGNOSTIC FINDINGS:  IMPRESSION: 1. 0.8 by 0.4 by 1.1 cm filling defect posteriorly in the glenohumeral joint, suspicious for a free chondral fragment. Focal chondral thinning and irregularity in the anterior and anterior inferior glenoid is suspicious for potential donor site. 2. Questionable small GLAD tear on ABER image 14 series 12 immediately adjacent to the chondral defect. 3. Mesoacromial os acromiale  PATIENT SURVEYS :  FOTO 72 82 @ DC 2 pts MCII   UPPER EXTREMITY ROM: Full ROM of bilateral UE, painful 90/90 IR on R- into infra/posterior cuff  UPPER EXTREMITY MMT:  MMT Right eval Left eval  Shoulder flexion 66.4 60.1  Shoulder extension 62.7 69.9  Shoulder abduction 57.1 61.3  Shoulder adduction    Shoulder internal rotation 33.1 31.7  Shoulder external rotation 23.9 (90/090) 41.6 (90/090)  (Blank rows =  not tested)  TODAY'S TREATMENT:                                                                                                                                         DATE:    7/18 R GHJ inf and post glide grade IV   Med ball chest pass to catch and land 3x5 10lb ball Full slide plank with rotation 3x8 Swissball pike 3x8 UE steps up lateral and fwd 10x each    7/15  UBE retro and fwd 2 min each  STM R infra and rear delt R GHJ inf and post glide grade IV   Self active release teres group with foam roller IR/ER 3 positions (10x each)  Dynamic pec stretch with rotation 2s hold 15x   Flat bench working up to 73% of 1RM (175lbs) 3x3  DB arnold press RPE 7 3x12 (25lbs each hand)  7/11  UBE retro and fwd 2 min each  STM R infra and mid trap R GHJ inf and post glide grade IV   Banded self inf GHJ glide R 10x 3s  Posterior banded shoulder stretch 10s 4x   D1/D2 flex and ext manual resisted  3x10 of each Clean technique analysis  5x4 cleans 95lbs   7/8  UBE retro and fwd 2 min each  Banded lat stretch with R bias 10s 4x Posterior banded shoulder stretch 10s 4x  Land mine press 25lbs with bar 3x10 (warm up sets prior) [employer] jerk 25lb 2x6 Landmine elbow abducted row 5x5 25lbs with bar  OH 10lb ball dribble 20s 3x Kneeling fall to catch 3x5   7/2  UBE retro and fwd 2 min each  Bench technique Clean pull technique   13lb KB drags 3x10 Tall plank with rotation reach 2x10 Bottoms up rack hold 15ft 10lbs 3x   6/21   Program Notes Tennis self release ( flexion and IR/ER) 10x with  Exercises - Full Plank with Scapular Protraction Retraction AROM  - 1 x daily - 3 x weekly - 3 sets - 10 reps - Kettlebell Drag  - 1 x daily - 3 x weekly - 3 sets - 12 reps - Kettlebell Bottom Up Carry  - 1 x daily - 3 x weekly - 1 sets - 3 reps - 16ft hold  PATIENT EDUCATION: Education details: MOI, diagnosis, prognosis, anatomy, exercise progression, DOMS expectations,  muscle firing,  envelope of function, HEP, POC  Person educated: Patient Education method: Explanation, Demonstration, Tactile cues, Verbal cues, and Handouts Education comprehension: verbalized understanding, returned demonstration, verbal cues required, tactile cues required, and needs further education  HOME EXERCISE PROGRAM:  Access Code: [email or web address] URL: [email or web address] Date: 04/07/2023 Prepared by: Zebedee Iba  ASSESSMENT:  CLINICAL IMPRESSION:  Patient exercises progressed today to full closed chain in order to simulate football related catching landing positions.  Close dchain also incorporated single upper extremity with good stability. However, patient does demonstrate continued fatigue and weakness with sustained repetitions.  By end of session last exercise repetitions reduced due to significant fatigue and increasing respiratory time.  No pain noted at all during session.  Patient does still continue get relief of stiffness in the right shoulder with manual mobilizations.  Plan to progress reactive strength and neck session with utilization of mobilizations as needed.  Consider throwing at next session as well given pt' complaints with throwing football at practice. Pt would benefit from continued skilled therapy in order to reach goals and maximize functional R UE strength and ROM for full return to PLOF as a Land.      OBJECTIVE IMPAIRMENTS: decreased strength, hypomobility, increased muscle spasms, impaired flexibility, impaired UE functional use, improper body mechanics, postural dysfunction, and pain.    ACTIVITY LIMITATIONS: carrying, lifting, dressing, reach over head, and hygiene/grooming   PARTICIPATION LIMITATIONS: cleaning, laundry, interpersonal relationship, shopping, community activity, occupation, yard work, and exercise   PERSONAL FACTORS: Fitness, Time since onset of injury/illness/exacerbation, are also affecting patient's functional  outcome.    REHAB POTENTIAL: Good   CLINICAL DECISION MAKING: Stable/uncomplicated   EVALUATION COMPLEXITY: Low   GOALS:  SHORT TERM GOALS: Target date: 05/19/2023    Will be compliant with appropriate progressive HEP  Baseline: Goal status: met   2.  Pt will be able to demonstrate ability to perform full OH and rotational AROM without pain in order to demonstrate functional improvement in UE function for self-care and house hold duties.  Baseline:  Goal status: met   3.  Pt will score at least 2 pt increase on FOTO to demonstrate functional improvement in MCII and pt perceived function.   Baseline:  Goal status: INITIAL        LONG TERM GOALS: Target date: 06/30/2023     Pt  will become independent with final HEP in order to demonstrate synthesis of PT education.    Goal status: INITIAL   2.  Pt will be able to demonstrate symmetrical HHD testing of R vs L shoulder strength in order to demonstrate functional improvement in LE function for return to normal  exercise and student athlete demands.      Goal status: INITIAL   3.  Pt will be able to demonstrate/report continuous throwing during practice without pain in order to demonstrate functional improvement in UE function for return to team play/practice.     Goal status: INITIAL   4.  Pt will be able to demonstrate ability to push/pull/press with the R UE without pain in order to demonstrate functional improvement in UE function for return to exercise.  Goal status: INITIAL     PLAN: PT FREQUENCY: 1x/week   PT DURATION: 12 weeks    PLANNED INTERVENTIONS: Therapeutic exercises, Therapeutic activity, Neuromuscular re-education, Balance training, Gait training, Patient/Family education, Self Care, Joint mobilization, Joint manipulation, Aquatic Therapy, Dry Needling, Electrical stimulation, Wheelchair mobility training, Spinal manipulation, Spinal mobilization, Moist heat, scar mobilization, Splintting, Taping,  Vasopneumatic device, Traction, Ultrasound, Ionotophoresis 4mg /ml Dexamethasone, Manual therapy, and Re-evaluation   PLAN FOR NEXT SESSION: R shoulder strength, scapular stability, TPDN and manual for R posterior shoulder, CKC loaded stability       Zebedee Iba, PT 05/04/2023, 8:44 AM

## 2023-05-08 ENCOUNTER — Ambulatory Visit (HOSPITAL_BASED_OUTPATIENT_CLINIC_OR_DEPARTMENT_OTHER): Payer: Commercial Managed Care - PPO

## 2023-05-11 ENCOUNTER — Ambulatory Visit (HOSPITAL_BASED_OUTPATIENT_CLINIC_OR_DEPARTMENT_OTHER): Payer: Commercial Managed Care - PPO

## 2023-05-11 ENCOUNTER — Encounter (HOSPITAL_BASED_OUTPATIENT_CLINIC_OR_DEPARTMENT_OTHER): Payer: Self-pay

## 2023-05-11 DIAGNOSIS — M25511 Pain in right shoulder: Secondary | ICD-10-CM | POA: Diagnosis not present

## 2023-05-11 DIAGNOSIS — M6281 Muscle weakness (generalized): Secondary | ICD-10-CM

## 2023-05-11 NOTE — Therapy (Signed)
OUTPATIENT PHYSICAL THERAPY UPPER EXTREMITY TREATMENT   Patient Name: Douglas Thompson MRN: [id number] DOB:06/29/2001, 22 y.o., male Today's Date: 05/11/2023  END OF SESSION:  PT End of Session - 05/11/23 0956     Visit Number 7    Number of Visits 21    Date for PT Re-Evaluation 07/06/23    Authorization Type [facility]    PT Start Time [phone]    PT Stop Time 0933    PT Time Calculation (min) 47 min    Activity Tolerance Patient tolerated treatment well    Behavior During Therapy [facility] for tasks assessed/performed                History reviewed. No pertinent past medical history. History reviewed. No pertinent surgical history. There are no problems to display for this patient.   PCP: N/A  REFERRING PROVIDER: Huel Cote, MD   REFERRING DIAG:  [phone] (ICD-10-CM) - Tear of right glenoid labrum, initial encounter      THERAPY DIAG:  Right shoulder pain, unspecified chronicity  Muscle weakness (generalized)  Rationale for Evaluation and Treatment: Rehabilitation  ONSET DATE: May 2024  SUBJECTIVE:                                                                                                                                                                                      SUBJECTIVE STATEMENT:  Pt repotrs he has been able to resume exercise without pain or discomfort. Football camp starts first week of August.  Eval:   Injury occurred during a spring scrimmage. Pt states that the right shoulder pain started after a football injury after making a tackle. Shoulder was abducted and dipped fwd into IR.  Pt states it felt loose after after he realxed and showered that night. Could not sleep on it due to sharp pain inside the joint. 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  Since that time pt has pain with overhead activities and benchpress. Benching felt shakey and weak but no longer. Does not feel unstable. Will get aggravated with throwing but then pain will  settle down after an hour. Can do OHP but has mild pain after. Denies popping clicking. Pt denies NT. Pt can touch the pain superficially. Reaching across the chest feels stiff but no stiffness otherwise going OH.    Pt is currently out for the summer and will return to training camp starting in August 3rd.    Prior to injury: 210-215 bench for 5x5; 185-200 (has not tried bench)  Hand dominance: Right  PERTINENT HISTORY: N/A   PAIN:  Are you having pain? No NPRS scale: 0/10; 5/10 Pain location: posterior hsoulder  Pain description: dull, achiness Aggravating factors: IR/ER, bench, throwing, benching, reaching backwards, sleeping on it  Relieving factors: resting by the side, ice/heat, ibuprofen   PRECAUTIONS: Shoulder  WEIGHT BEARING RESTRICTIONS: No  FALLS:  Has patient fallen in last 6 months? No  LIVING ENVIRONMENT: Lives with: lives with their family Lives in: House/apartment  OCCUPATION: Engineer, site; Land  PLOF: Independent  PATIENT GOALS: return to football    OBJECTIVE:   DIAGNOSTIC FINDINGS:  IMPRESSION: 1. 0.8 by 0.4 by 1.1 cm filling defect posteriorly in the glenohumeral joint, suspicious for a free chondral fragment. Focal chondral thinning and irregularity in the anterior and anterior inferior glenoid is suspicious for potential donor site. 2. Questionable small GLAD tear on ABER image 14 series 12 immediately adjacent to the chondral defect. 3. Mesoacromial os acromiale  PATIENT SURVEYS :  FOTO 72 82 @ DC 2 pts MCII   UPPER EXTREMITY ROM: Full ROM of bilateral UE, painful 90/90 IR on R- into infra/posterior cuff  UPPER EXTREMITY MMT:  MMT Right eval Left eval  Shoulder flexion 66.4 60.1  Shoulder extension 62.7 69.9  Shoulder abduction 57.1 61.3  Shoulder adduction    Shoulder internal rotation 33.1 31.7  Shoulder external rotation 23.9 (90/090) 41.6 (90/090)  (Blank rows = not tested)  TODAY'S TREATMENT:                                                                                                                                          DATE:    7/25  UBE L3 retro and fwd 2.5 min each R GHJ inf and post glide grade III Prone 90/90 ER 1#/2# x10ea Prone H abd neutral and ER 1# 2x10ea  Plank with wgt shift 2x10 Rhythmic stabilization  Serratus raises with green PS loop x10 Reverse wall push up x10  OH ball 4 way 3.3lbs x30ea Ball lift off in half circle at wall 2.2# x5  7/18 R GHJ inf and post glide grade IV   Med ball chest pass to catch and land 3x5 10lb ball Full slide plank with rotation 3x8 Swissball pike 3x8 UE steps up lateral and fwd 10x each    7/15  UBE retro and fwd 2 min each  STM R infra and rear delt R GHJ inf and post glide grade IV   Self active release teres group with foam roller IR/ER 3 positions (10x each)  Dynamic pec stretch with rotation 2s hold 15x   Flat bench working up to 73% of 1RM (175lbs) 3x3  DB arnold press RPE 7 3x12 (25lbs each hand)  7/11  UBE retro and fwd 2 min each  STM R infra and mid trap R GHJ inf and post glide grade IV   Banded self inf GHJ glide R 10x 3s  Posterior banded shoulder stretch 10s 4x   D1/D2 flex and ext manual resisted  3x10  of each Clean technique analysis  5x4 cleans 95lbs   7/8  UBE retro and fwd 2 min each  Banded lat stretch with R bias 10s 4x Posterior banded shoulder stretch 10s 4x  Land mine press 25lbs with bar 3x10 (warm up sets prior) [employer] jerk 25lb 2x6 Landmine elbow abducted row 5x5 25lbs with bar  OH 10lb ball dribble 20s 3x Kneeling fall to catch 3x5   7/2  UBE retro and fwd 2 min each  Bench technique Clean pull technique   13lb KB drags 3x10 Tall plank with rotation reach 2x10 Bottoms up rack hold 32ft 10lbs 3x   6/21   Program Notes Tennis self release ( flexion and IR/ER) 10x with  Exercises - Full Plank with Scapular Protraction Retraction AROM  - 1 x daily - 3  x weekly - 3 sets - 10 reps - Kettlebell Drag  - 1 x daily - 3 x weekly - 3 sets - 12 reps - Kettlebell Bottom Up Carry  - 1 x daily - 3 x weekly - 1 sets - 3 reps - 54ft hold  PATIENT EDUCATION: Education details: MOI, diagnosis, prognosis, anatomy, exercise progression, DOMS expectations, muscle firing,  envelope of function, HEP, POC  Person educated: Patient Education method: Explanation, Demonstration, Tactile cues, Verbal cues, and Handouts Education comprehension: verbalized understanding, returned demonstration, verbal cues required, tactile cues required, and needs further education  HOME EXERCISE PROGRAM:  Access Code: [email or web address] URL: [email or web address] Date: 04/07/2023 Prepared by: Zebedee Iba  ASSESSMENT:  CLINICAL IMPRESSION:  Patient does note some sharp discomfort with end range passive IR. Also replicated with active IR. Pt fatigued quickly with plank position, demonstrates endurance deficits with this. Pt was also challenged by serratus flexion with resistance and reverse wall push ups.  Updated HEP to include serratus flexion. Plank wgt shifts, and reverse wall push ups.   OBJECTIVE IMPAIRMENTS: decreased strength, hypomobility, increased muscle spasms, impaired flexibility, impaired UE functional use, improper body mechanics, postural dysfunction, and pain.    ACTIVITY LIMITATIONS: carrying, lifting, dressing, reach over head, and hygiene/grooming   PARTICIPATION LIMITATIONS: cleaning, laundry, interpersonal relationship, shopping, community activity, occupation, yard work, and exercise   PERSONAL FACTORS: Fitness, Time since onset of injury/illness/exacerbation, are also affecting patient's functional outcome.    REHAB POTENTIAL: Good   CLINICAL DECISION MAKING: Stable/uncomplicated   EVALUATION COMPLEXITY: Low   GOALS:  SHORT TERM GOALS: Target date: 05/19/2023    Will be compliant with appropriate progressive HEP  Baseline: Goal status:  met   2.  Pt will be able to demonstrate ability to perform full OH and rotational AROM without pain in order to demonstrate functional improvement in UE function for self-care and house hold duties.  Baseline:  Goal status: met   3.  Pt will score at least 2 pt increase on FOTO to demonstrate functional improvement in MCII and pt perceived function.   Baseline:  Goal status: INITIAL        LONG TERM GOALS: Target date: 06/30/2023     Pt  will become independent with final HEP in order to demonstrate synthesis of PT education.    Goal status: INITIAL   2.  Pt will be able to demonstrate symmetrical HHD testing of R vs L shoulder strength in order to demonstrate functional improvement in LE function for return to normal exercise and student athlete demands.      Goal status: INITIAL   3.  Pt will be able to demonstrate/report continuous throwing during  practice without pain in order to demonstrate functional improvement in UE function for return to team play/practice.     Goal status: INITIAL   4.  Pt will be able to demonstrate ability to push/pull/press with the R UE without pain in order to demonstrate functional improvement in UE function for return to exercise.  Goal status: INITIAL     PLAN: PT FREQUENCY: 1x/week   PT DURATION: 12 weeks    PLANNED INTERVENTIONS: Therapeutic exercises, Therapeutic activity, Neuromuscular re-education, Balance training, Gait training, Patient/Family education, Self Care, Joint mobilization, Joint manipulation, Aquatic Therapy, Dry Needling, Electrical stimulation, Wheelchair mobility training, Spinal manipulation, Spinal mobilization, Moist heat, scar mobilization, Splintting, Taping, Vasopneumatic device, Traction, Ultrasound, Ionotophoresis 4mg /ml Dexamethasone, Manual therapy, and Re-evaluation   PLAN FOR NEXT SESSION: R shoulder strength, scapular stability, TPDN and manual for R posterior shoulder, CKC loaded stability        Donnel Saxon Larhonda Dettloff, PTA 05/11/2023, 10:02 AM

## 2023-05-15 ENCOUNTER — Encounter (HOSPITAL_BASED_OUTPATIENT_CLINIC_OR_DEPARTMENT_OTHER): Payer: Self-pay

## 2023-05-15 ENCOUNTER — Ambulatory Visit (HOSPITAL_BASED_OUTPATIENT_CLINIC_OR_DEPARTMENT_OTHER): Payer: Commercial Managed Care - PPO

## 2023-05-15 DIAGNOSIS — M6281 Muscle weakness (generalized): Secondary | ICD-10-CM

## 2023-05-15 DIAGNOSIS — M25511 Pain in right shoulder: Secondary | ICD-10-CM

## 2023-05-15 NOTE — Therapy (Signed)
OUTPATIENT PHYSICAL THERAPY UPPER EXTREMITY TREATMENT   Patient Name: Douglas Thompson MRN: [id number] DOB:04/12/2001, 22 y.o., male Today's Date: 05/15/2023  END OF SESSION:  PT End of Session - 05/15/23 0908     Visit Number 8    Number of Visits 21    Date for PT Re-Evaluation 07/06/23    Authorization Type [facility]    PT Start Time [phone]    PT Stop Time 0931    PT Time Calculation (min) 42 min    Activity Tolerance Patient tolerated treatment well    Behavior During Therapy [facility] for tasks assessed/performed                 History reviewed. No pertinent past medical history. History reviewed. No pertinent surgical history. There are no problems to display for this patient.   PCP: N/A  REFERRING PROVIDER: Huel Cote, MD   REFERRING DIAG:  [phone] (ICD-10-CM) - Tear of right glenoid labrum, initial encounter      THERAPY DIAG:  Muscle weakness (generalized)  Right shoulder pain, unspecified chronicity  Rationale for Evaluation and Treatment: Rehabilitation  ONSET DATE: May 2024  SUBJECTIVE:                                                                                                                                                                                      SUBJECTIVE STATEMENT:  Pt reports he was out of town this weekend and did not exercise. No complaints in R shoulder at entry. No significant soreness following last sesison.   Eval:   Injury occurred during a spring scrimmage. Pt states that the right shoulder pain started after a football injury after making a tackle. Shoulder was abducted and dipped fwd into IR.  Pt states it felt loose after after he realxed and showered that night. Could not sleep on it due to sharp pain inside the joint. Pt plays safety at [employer].  Since that time pt has pain with overhead activities and benchpress. Benching felt shakey and weak but no longer. Does not feel unstable. Will get  aggravated with throwing but then pain will settle down after an hour. Can do OHP but has mild pain after. Denies popping clicking. Pt denies NT. Pt can touch the pain superficially. Reaching across the chest feels stiff but no stiffness otherwise going OH.    Pt is currently out for the summer and will return to training camp starting in August 3rd.    Prior to injury: 210-215 bench for 5x5; 185-200 (has not tried bench)  Hand dominance: Right  PERTINENT HISTORY: N/A   PAIN:  Are you having pain? No NPRS  scale: 0/10; 5/10 Pain location: posterior hsoulder  Pain description: dull, achiness Aggravating factors: IR/ER, bench, throwing, benching, reaching backwards, sleeping on it  Relieving factors: resting by the side, ice/heat, ibuprofen   PRECAUTIONS: Shoulder  WEIGHT BEARING RESTRICTIONS: No  FALLS:  Has patient fallen in last 6 months? No  LIVING ENVIRONMENT: Lives with: lives with their family Lives in: House/apartment  OCCUPATION: Engineer, site; Land  PLOF: Independent  PATIENT GOALS: return to football    OBJECTIVE:   DIAGNOSTIC FINDINGS:  IMPRESSION: 1. 0.8 by 0.4 by 1.1 cm filling defect posteriorly in the glenohumeral joint, suspicious for a free chondral fragment. Focal chondral thinning and irregularity in the anterior and anterior inferior glenoid is suspicious for potential donor site. 2. Questionable small GLAD tear on ABER image 14 series 12 immediately adjacent to the chondral defect. 3. Mesoacromial os acromiale  PATIENT SURVEYS :  FOTO 72 82 @ DC 2 pts MCII   UPPER EXTREMITY ROM: Full ROM of bilateral UE, painful 90/90 IR on R- into infra/posterior cuff  UPPER EXTREMITY MMT:  MMT Right eval Left eval  Shoulder flexion 66.4 60.1  Shoulder extension 62.7 69.9  Shoulder abduction 57.1 61.3  Shoulder adduction    Shoulder internal rotation 33.1 31.7  Shoulder external rotation 23.9 (90/090) 41.6 (90/090)  (Blank rows =  not tested)  TODAY'S TREATMENT:                                                                                                                                         DATE:    7/29  UBE L3 retro and fwd 2.5 min each R GHJ inf and post glide grade III PROM R shoulder Prone 90/90 ER 2# 2x10ea Prone H abd neutral and ER 2# 2x10ea  Plank with wgt shift x10, x10 with lift off, x10 with shoulder taps Rhythmic stabilization multi angle flexion and IR/ER  Serratus raises with green PS loop 2x10 Wall clock green PS loop x10ea Reverse wall push up 2x10    7/25  UBE L3 retro and fwd 2.5 min each R GHJ inf and post glide grade III Prone 90/90 ER 1#/2# x10ea Prone H abd neutral and ER 1# 2x10ea  Plank with wgt shift 2x10 Rhythmic stabilization  Serratus raises with green PS loop x10 Reverse wall push up x10  OH ball 4 way 3.3lbs x30ea Ball lift off in half circle at wall 2.2# x5  7/18 R GHJ inf and post glide grade IV   Med ball chest pass to catch and land 3x5 10lb ball Full slide plank with rotation 3x8 Swissball pike 3x8 UE steps up lateral and fwd 10x each    7/15  UBE retro and fwd 2 min each  STM R infra and rear delt R GHJ inf and post glide grade IV   Self active release teres group with foam roller IR/ER 3 positions (  10x each)  Dynamic pec stretch with rotation 2s hold 15x   Flat bench working up to 73% of 1RM (175lbs) 3x3  DB arnold press RPE 7 3x12 (25lbs each hand)  7/11  UBE retro and fwd 2 min each  STM R infra and mid trap R GHJ inf and post glide grade IV   Banded self inf GHJ glide R 10x 3s  Posterior banded shoulder stretch 10s 4x   D1/D2 flex and ext manual resisted  3x10 of each Clean technique analysis  5x4 cleans 95lbs   7/8  UBE retro and fwd 2 min each  Banded lat stretch with R bias 10s 4x Posterior banded shoulder stretch 10s 4x  Land mine press 25lbs with bar 3x10 (warm up sets prior) [employer] jerk 25lb 2x6 Landmine  elbow abducted row 5x5 25lbs with bar  OH 10lb ball dribble 20s 3x Kneeling fall to catch 3x5   7/2  UBE retro and fwd 2 min each  Bench technique Clean pull technique   13lb KB drags 3x10 Tall plank with rotation reach 2x10 Bottoms up rack hold 70ft 10lbs 3x   6/21   Program Notes Tennis self release ( flexion and IR/ER) 10x with  Exercises - Full Plank with Scapular Protraction Retraction AROM  - 1 x daily - 3 x weekly - 3 sets - 10 reps - Kettlebell Drag  - 1 x daily - 3 x weekly - 3 sets - 12 reps - Kettlebell Bottom Up Carry  - 1 x daily - 3 x weekly - 1 sets - 3 reps - 2ft hold  PATIENT EDUCATION: Education details: MOI, diagnosis, prognosis, anatomy, exercise progression, DOMS expectations, muscle firing,  envelope of function, HEP, POC  Person educated: Patient Education method: Explanation, Demonstration, Tactile cues, Verbal cues, and Handouts Education comprehension: verbalized understanding, returned demonstration, verbal cues required, tactile cues required, and needs further education  HOME EXERCISE PROGRAM:  Access Code: [email or web address] URL: [email or web address] Date: 04/07/2023 Prepared by: Zebedee Iba  ASSESSMENT:  CLINICAL IMPRESSION:  Pt remains limited in IR with some discomfort here passively and actively. He fatigued quickly with IR/ER rhythimc stabilization at 45 deg abd. Able to increase repetitions with serratus raises and reverse wall push ups today. Trialled resisted wall clocks with pt requiring multiple rest breaks with this due to fatigue. Pt plans to return to football camp early next month.  OBJECTIVE IMPAIRMENTS: decreased strength, hypomobility, increased muscle spasms, impaired flexibility, impaired UE functional use, improper body mechanics, postural dysfunction, and pain.    ACTIVITY LIMITATIONS: carrying, lifting, dressing, reach over head, and hygiene/grooming   PARTICIPATION LIMITATIONS: cleaning, laundry, interpersonal  relationship, shopping, community activity, occupation, yard work, and exercise   PERSONAL FACTORS: Fitness, Time since onset of injury/illness/exacerbation, are also affecting patient's functional outcome.    REHAB POTENTIAL: Good   CLINICAL DECISION MAKING: Stable/uncomplicated   EVALUATION COMPLEXITY: Low   GOALS:  SHORT TERM GOALS: Target date: 05/19/2023    Will be compliant with appropriate progressive HEP  Baseline: Goal status: met   2.  Pt will be able to demonstrate ability to perform full OH and rotational AROM without pain in order to demonstrate functional improvement in UE function for self-care and house hold duties.  Baseline:  Goal status: met   3.  Pt will score at least 2 pt increase on FOTO to demonstrate functional improvement in MCII and pt perceived function.   Baseline:  Goal status: INITIAL        LONG  TERM GOALS: Target date: 06/30/2023     Pt  will become independent with final HEP in order to demonstrate synthesis of PT education.    Goal status: INITIAL   2.  Pt will be able to demonstrate symmetrical HHD testing of R vs L shoulder strength in order to demonstrate functional improvement in LE function for return to normal exercise and student athlete demands.      Goal status: INITIAL   3.  Pt will be able to demonstrate/report continuous throwing during practice without pain in order to demonstrate functional improvement in UE function for return to team play/practice.     Goal status: INITIAL   4.  Pt will be able to demonstrate ability to push/pull/press with the R UE without pain in order to demonstrate functional improvement in UE function for return to exercise.  Goal status: INITIAL     PLAN: PT FREQUENCY: 1x/week   PT DURATION: 12 weeks    PLANNED INTERVENTIONS: Therapeutic exercises, Therapeutic activity, Neuromuscular re-education, Balance training, Gait training, Patient/Family education, Self Care, Joint mobilization, Joint  manipulation, Aquatic Therapy, Dry Needling, Electrical stimulation, Wheelchair mobility training, Spinal manipulation, Spinal mobilization, Moist heat, scar mobilization, Splintting, Taping, Vasopneumatic device, Traction, Ultrasound, Ionotophoresis 4mg /ml Dexamethasone, Manual therapy, and Re-evaluation   PLAN FOR NEXT SESSION: R shoulder strength, scapular stability, TPDN and manual for R posterior shoulder, CKC loaded stability       Donnel Saxon Lis Savitt, PTA 05/15/2023, 11:55 AM

## 2023-05-18 ENCOUNTER — Encounter (HOSPITAL_BASED_OUTPATIENT_CLINIC_OR_DEPARTMENT_OTHER): Payer: Self-pay | Admitting: Physical Therapy

## 2023-05-18 ENCOUNTER — Ambulatory Visit (HOSPITAL_BASED_OUTPATIENT_CLINIC_OR_DEPARTMENT_OTHER): Payer: Commercial Managed Care - PPO | Attending: Orthopaedic Surgery | Admitting: Physical Therapy

## 2023-05-18 DIAGNOSIS — M25511 Pain in right shoulder: Secondary | ICD-10-CM | POA: Insufficient documentation

## 2023-05-18 DIAGNOSIS — M6281 Muscle weakness (generalized): Secondary | ICD-10-CM | POA: Insufficient documentation

## 2023-05-18 NOTE — Therapy (Signed)
OUTPATIENT PHYSICAL THERAPY UPPER EXTREMITY TREATMENT   Patient Name: Douglas Thompson MRN: [id number] DOB:[birth year], 22 y.o., male Today's Date: 05/18/2023  END OF SESSION:  PT End of Session - 05/18/23 0925     Visit Number 9    Number of Visits 21    Date for PT Re-Evaluation 07/06/23    Authorization Type [facility]    PT Start Time [phone]    PT Stop Time 0920    PT Time Calculation (min) 30 min    Activity Tolerance Patient tolerated treatment well    Behavior During Therapy [facility] for tasks assessed/performed                  History reviewed. No pertinent past medical history. History reviewed. No pertinent surgical history. There are no problems to display for this patient.   PCP: N/A  REFERRING PROVIDER: Huel Cote, MD   REFERRING DIAG:  [phone] (ICD-10-CM) - Tear of right glenoid labrum, initial encounter      THERAPY DIAG:  Muscle weakness (generalized)  Right shoulder pain, unspecified chronicity  Rationale for Evaluation and Treatment: Rehabilitation  ONSET DATE: May 2024  SUBJECTIVE:                                                                                                                                                                                      SUBJECTIVE STATEMENT:  Pt reports the shouldre feels good. He starts training camp Monday. He still has pain with IR but it is much improved.   Eval:   Injury occurred during a spring scrimmage. Pt states that the right shoulder pain started after a football injury after making a tackle. Shoulder was abducted and dipped fwd into IR.  Pt states it felt loose after after he realxed and showered that night. Could not sleep on it due to sharp pain inside the joint. 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  Since that time pt has pain with overhead activities and benchpress. Benching felt shakey and weak but no longer. Does not feel unstable. Will get aggravated with throwing but then pain  will settle down after an hour. Can do OHP but has mild pain after. Denies popping clicking. Pt denies NT. Pt can touch the pain superficially. Reaching across the chest feels stiff but no stiffness otherwise going OH.    Pt is currently out for the summer and will return to training camp starting in August 3rd.    Prior to injury: 210-215 bench for 5x5; 185-200 (has not tried bench)  Hand dominance: Right  PERTINENT HISTORY: N/A   PAIN:  Are you having pain? No NPRS scale: 0/10; 5/10  Pain location: posterior hsoulder  Pain description: dull, achiness Aggravating factors: IR/ER, bench, throwing, benching, reaching backwards, sleeping on it  Relieving factors: resting by the side, ice/heat, ibuprofen   PRECAUTIONS: Shoulder  WEIGHT BEARING RESTRICTIONS: No  FALLS:  Has patient fallen in last 6 months? No  LIVING ENVIRONMENT: Lives with: lives with their family Lives in: House/apartment  OCCUPATION: Engineer, site; Land  PLOF: Independent  PATIENT GOALS: return to football    OBJECTIVE:   DIAGNOSTIC FINDINGS:  IMPRESSION: 1. 0.8 by 0.4 by 1.1 cm filling defect posteriorly in the glenohumeral joint, suspicious for a free chondral fragment. Focal chondral thinning and irregularity in the anterior and anterior inferior glenoid is suspicious for potential donor site. 2. Questionable small GLAD tear on ABER image 14 series 12 immediately adjacent to the chondral defect. 3. Mesoacromial os acromiale  PATIENT SURVEYS :  FOTO 72 82 @ DC 2 pts MCII  FOTO: 8/1 80%   UPPER EXTREMITY ROM: Full ROM of bilateral UE, discomfort with 90/90 IR on R- into infra/posterior cuff; no pain  UPPER EXTREMITY MMT:  MMT Right eval R 8/1 Left eval L  8/1   Shoulder flexion 66.4  60.1   Shoulder extension 62.7  69.9   Shoulder abduction 57.1  61.3   Shoulder adduction      Shoulder internal rotation 33.1  31.7   Shoulder external rotation 23.9 (90/090) 39.2   41.6 (90/090) 44.0  (Blank rows = not tested)  TODAY'S TREATMENT:                                                                                                                                         DATE:    8/1  Trigger Point Dry-Needling  Treatment instructions: Expect mild to moderate muscle soreness. S/S of pneumothorax if dry needled over a lung field, and to seek immediate medical attention should they occur. Patient verbalized understanding of these instructions and education.   Patient Consent Given: Yes Education (verbally/handout)provided: Yes Muscles treated: R infra Electrical stimulation performed: N/A Parameters:   N/A Treatment response/outcome: several very large twitch responses elicited, palpable increase in soft tissue extensibility  STM R infra and active release infra  HEP updates  Reverse push up 3x10 90/90 band ER blue 3x10      7/29  UBE L3 retro and fwd 2.5 min each R GHJ inf and post glide grade III PROM R shoulder Prone 90/90 ER 2# 2x10ea Prone H abd neutral and ER 2# 2x10ea  Plank with wgt shift x10, x10 with lift off, x10 with shoulder taps Rhythmic stabilization multi angle flexion and IR/ER  Serratus raises with green PS loop 2x10 Wall clock green PS loop x10ea Reverse wall push up 2x10    7/25  UBE L3 retro and fwd 2.5 min each R GHJ inf and post glide grade III Prone 90/90 ER 1#/2# x10ea Prone  H abd neutral and ER 1# 2x10ea  Plank with wgt shift 2x10 Rhythmic stabilization  Serratus raises with green PS loop x10 Reverse wall push up x10  OH ball 4 way 3.3lbs x30ea Ball lift off in half circle at wall 2.2# x5  7/18 R GHJ inf and post glide grade IV   Med ball chest pass to catch and land 3x5 10lb ball Full slide plank with rotation 3x8 Swissball pike 3x8 UE steps up lateral and fwd 10x each    7/15  UBE retro and fwd 2 min each  STM R infra and rear delt R GHJ inf and post glide grade IV   Self active release  teres group with foam roller IR/ER 3 positions (10x each)  Dynamic pec stretch with rotation 2s hold 15x   Flat bench working up to 73% of 1RM (175lbs) 3x3  DB arnold press RPE 7 3x12 (25lbs each hand)  7/11  UBE retro and fwd 2 min each  STM R infra and mid trap R GHJ inf and post glide grade IV   Banded self inf GHJ glide R 10x 3s  Posterior banded shoulder stretch 10s 4x   D1/D2 flex and ext manual resisted  3x10 of each Clean technique analysis  5x4 cleans 95lbs   7/8  UBE retro and fwd 2 min each  Banded lat stretch with R bias 10s 4x Posterior banded shoulder stretch 10s 4x  Land mine press 25lbs with bar 3x10 (warm up sets prior) [employer] jerk 25lb 2x6 Landmine elbow abducted row 5x5 25lbs with bar  OH 10lb ball dribble 20s 3x Kneeling fall to catch 3x5   7/2  UBE retro and fwd 2 min each  Bench technique Clean pull technique   13lb KB drags 3x10 Tall plank with rotation reach 2x10 Bottoms up rack hold 40ft 10lbs 3x   6/21   Program Notes Tennis self release ( flexion and IR/ER) 10x with  Exercises - Full Plank with Scapular Protraction Retraction AROM  - 1 x daily - 3 x weekly - 3 sets - 10 reps - Kettlebell Drag  - 1 x daily - 3 x weekly - 3 sets - 12 reps - Kettlebell Bottom Up Carry  - 1 x daily - 3 x weekly - 1 sets - 3 reps - 23ft hold  PATIENT EDUCATION: Education details: MOI, diagnosis, prognosis, anatomy, exercise progression, DOMS expectations, muscle firing,  envelope of function, HEP, POC  Person educated: Patient Education method: Explanation, Demonstration, Tactile cues, Verbal cues, and Handouts Education comprehension: verbalized understanding, returned demonstration, verbal cues required, tactile cues required, and needs further education  HOME EXERCISE PROGRAM:  Access Code: [email or web address] URL: [email or web address] Date: 04/07/2023 Prepared by: Zebedee Iba  ASSESSMENT:  CLINICAL IMPRESSION:  Patient with  significant improvement in discomfort following trigger point dry needling of right infraspinatus.  The several significant large twitch responses were elicited with palpable increase in soft tissue extensibility following.  Patient had total abolishment of pain with internal rotation following trigger point dry needling and manual therapy.  Patient advised to talk to athletic trainer regarding future dry needling for pain management purposes.  Patient has also increased right infraspinatus external rotation strength significantly in 90/90 position without pain.  Patient is nearly 90% strength of the other side.  Patient home exercise updated today to improve shoulder external rotation strength in addition to close chain exercise for shoulder endurance.  Patient to start training camp this coming week.  Patient advised to  return to therapy should pain return.  However, patient appears able to tolerate close chain plyometrics without issue.  Plan to assess for shoulder tolerance to high intensity and dynamic environment with football.  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  OBJECTIVE IMPAIRMENTS: decreased strength, hypomobility, increased muscle spasms, impaired flexibility, impaired UE functional use, improper body mechanics, postural dysfunction, and pain.    ACTIVITY LIMITATIONS: carrying, lifting, dressing, reach over head, and hygiene/grooming   PARTICIPATION LIMITATIONS: cleaning, laundry, interpersonal relationship, shopping, community activity, occupation, yard work, and exercise   PERSONAL FACTORS: Fitness, Time since onset of injury/illness/exacerbation, are also affecting patient's functional outcome.    REHAB POTENTIAL: Good   CLINICAL DECISION MAKING: Stable/uncomplicated   EVALUATION COMPLEXITY: Low   GOALS:  SHORT TERM GOALS: Target date: 05/19/2023    Will be compliant  with appropriate progressive HEP  Baseline: Goal status: met   2.  Pt will be able to demonstrate ability to perform full OH and rotational AROM without pain in order to demonstrate functional improvement in UE function for self-care and house hold duties.  Baseline:  Goal status: met   3.  Pt will score at least 2 pt increase on FOTO to demonstrate functional improvement in MCII and pt perceived function.   Baseline:  Goal status: met        LONG TERM GOALS: Target date: 06/30/2023     Pt  will become independent with final HEP in order to demonstrate synthesis of PT education.    Goal status: met   2.  Pt will be able to demonstrate symmetrical HHD testing of R vs L shoulder strength in order to demonstrate functional improvement in LE function for return to normal exercise and student athlete demands.      Goal status: ongoing   3.  Pt will be able to demonstrate/report continuous throwing during practice without pain in order to demonstrate functional improvement in UE function for return to team play/practice.     Goal status: met   4.  Pt will be able to demonstrate ability to push/pull/press with the R UE without pain in order to demonstrate functional improvement in UE function for return to exercise.  Goal status: met     PLAN: PT FREQUENCY: 1x/week   PT DURATION: 12 weeks    PLANNED INTERVENTIONS: Therapeutic exercises, Therapeutic activity, Neuromuscular re-education, Balance training, Gait training, Patient/Family education, Self Care, Joint mobilization, Joint manipulation, Aquatic Therapy, Dry Needling, Electrical stimulation, Wheelchair mobility training, Spinal manipulation, Spinal mobilization, Moist heat, scar mobilization, Splintting, Taping, Vasopneumatic device, Traction, Ultrasound, Ionotophoresis 4mg /ml Dexamethasone, Manual therapy, and Re-evaluation   PLAN FOR NEXT SESSION: R shoulder strength, scapular stability, TPDN and manual for R posterior  shoulder, CKC loaded stability       Zebedee Iba, PT 05/18/2023, 9:29 AM

## 2023-11-29 ENCOUNTER — Ambulatory Visit (HOSPITAL_BASED_OUTPATIENT_CLINIC_OR_DEPARTMENT_OTHER): Payer: Commercial Managed Care - PPO | Attending: Orthopaedic Surgery | Admitting: Physical Therapy

## 2023-11-29 DIAGNOSIS — M25511 Pain in right shoulder: Secondary | ICD-10-CM | POA: Insufficient documentation

## 2023-11-29 DIAGNOSIS — M6281 Muscle weakness (generalized): Secondary | ICD-10-CM | POA: Insufficient documentation

## 2023-11-29 NOTE — Therapy (Signed)
Patient arrived requesting needling to his knee musculature and potentially lower back. He has only been evaluated for his neck and shoulder. He was advised he will need a script. He was given the name of [facility] for walk in dry needling.

## 2023-12-11 DIAGNOSIS — M25561 Pain in right knee: Secondary | ICD-10-CM | POA: Diagnosis not present

## 2023-12-14 DIAGNOSIS — M25561 Pain in right knee: Secondary | ICD-10-CM | POA: Diagnosis not present

## 2023-12-17 ENCOUNTER — Other Ambulatory Visit (INDEPENDENT_AMBULATORY_CARE_PROVIDER_SITE_OTHER): Payer: Self-pay | Admitting: Primary Care

## 2023-12-17 DIAGNOSIS — G8929 Other chronic pain: Secondary | ICD-10-CM

## 2023-12-18 DIAGNOSIS — M25561 Pain in right knee: Secondary | ICD-10-CM | POA: Diagnosis not present

## 2023-12-28 DIAGNOSIS — M25561 Pain in right knee: Secondary | ICD-10-CM | POA: Diagnosis not present

## 2024-01-03 DIAGNOSIS — M25561 Pain in right knee: Secondary | ICD-10-CM | POA: Diagnosis not present

## 2024-01-30 DIAGNOSIS — H18621 Keratoconus, unstable, right eye: Secondary | ICD-10-CM | POA: Diagnosis not present

## 2024-01-30 DIAGNOSIS — H18622 Keratoconus, unstable, left eye: Secondary | ICD-10-CM | POA: Diagnosis not present

## 2024-06-13 ENCOUNTER — Encounter: Payer: Self-pay | Admitting: Emergency Medicine

## 2024-06-13 ENCOUNTER — Ambulatory Visit
Admission: EM | Admit: 2024-06-13 | Discharge: 2024-06-13 | Disposition: A | Discharge status: Home / Self Care | Attending: Physician Assistant | Admitting: Physician Assistant

## 2024-06-13 ENCOUNTER — Telehealth: Payer: Self-pay | Admitting: Physician Assistant

## 2024-06-13 DIAGNOSIS — Z113 Encounter for screening for infections with a predominantly sexual mode of transmission: Secondary | ICD-10-CM

## 2024-06-13 DIAGNOSIS — R300 Dysuria: Secondary | ICD-10-CM | POA: Diagnosis not present

## 2024-06-13 DIAGNOSIS — R3915 Urgency of urination: Secondary | ICD-10-CM

## 2024-06-13 LAB — POCT URINE DIPSTICK
Bilirubin, UA: NEGATIVE
Blood, UA: NEGATIVE
Glucose, UA: NEGATIVE mg/dL
Ketones, POC UA: NEGATIVE mg/dL
Leukocytes, UA: NEGATIVE
Nitrite, UA: NEGATIVE
POC PROTEIN,UA: NEGATIVE
Spec Grav, UA: 1.02 (ref 1.010–1.025)
Urobilinogen, UA: 0.2 U/dL
pH, UA: 8 (ref 5.0–8.0)

## 2024-06-13 NOTE — Progress Notes (Signed)
 E-Visit for Urinary Problems  Based on what you shared with me, I feel your condition warrants further evaluation and I recommend that you be seen for a face to face office visit.  Male bladder infections are not very common.  We worry about prostate or kidney conditions.  The standard of care is to examine the abdomen and kidneys, and to do a urine and blood test to make sure that something more serious is not going on.  We recommend that you see a provider today.  If your doctor's office is closed [place] has the following Urgent Cares:   NOTE: There will be NO CHARGE for this E-Visit   If you are having a true medical emergency, please call 911.     For an urgent face to face visit, [place] has multiple urgent care centers for your convenience.  Click the link below for the full list of locations and hours, walk-in wait times, appointment scheduling options and driving directions:  Urgent Care - [place], [place], [place], [place], [place], [place]       Your MyChart E-visit questionnaire answers were reviewed by a board certified advanced clinical practitioner to complete your personal care plan based on your specific symptoms.  Thank you for using e-Visits.

## 2024-06-13 NOTE — ED Triage Notes (Signed)
 Pt presents c/o frequent urination x 2 days. Pt reports he has had no other sxs but wants to be tested just to be safe.

## 2024-06-13 NOTE — ED Provider Notes (Signed)
 [facility]    CSN: [id number] Arrival date & time: 06/13/24  1618      History   Chief Complaint Chief Complaint  Patient presents with   Exposure to STD   Urinary Frequency    HPI Douglas Thompson is a 23 y.o. male.   Patient here today for evaluation of frequent urination and dysuria that started 2 days ago.  He states that he has not had any known STD exposures but would like to be screened to be on the safe side.  He denies any penile discharge.  The history is provided by the patient.  Exposure to STD Pertinent negatives include no shortness of breath.  Urinary Frequency Pertinent negatives include no shortness of breath.    History reviewed. No pertinent past medical history.  There are no active problems to display for this patient.   History reviewed. No pertinent surgical history.     Home Medications    Prior to Admission medications   Medication Sig Start Date End Date Taking? Authorizing Provider  meloxicam  (MOBIC ) 15 MG tablet Take 1 tablet (15 mg total) by mouth daily. 09/16/21   Joya Stabs, DPM    Family History History reviewed. No pertinent family history.  Social History Social History   Tobacco Use   Smoking status: Never    Passive exposure: Never   Smokeless tobacco: Never  Vaping Use   Vaping status: Never Used     Allergies   Amoxicillin   Review of Systems Review of Systems  Constitutional:  Negative for chills and fever.  Eyes:  Negative for discharge and redness.  Respiratory:  Negative for shortness of breath.   Genitourinary:  Positive for frequency. Negative for penile discharge.  Musculoskeletal:  Negative for back pain.  Neurological:  Negative for numbness.     Physical Exam Triage Vital Signs ED Triage Vitals  Encounter Vitals Group     BP 06/13/24 1639 (!) 159/79     Girls Systolic BP Percentile --      Girls Diastolic BP Percentile --      Boys Systolic BP Percentile --      Boys  Diastolic BP Percentile --      Pulse Rate 06/13/24 1639 82     Resp 06/13/24 1639 16     Temp 06/13/24 1639 98.2 F (36.8 C)     Temp Source 06/13/24 1639 Oral     SpO2 06/13/24 1639 97 %     Weight 06/13/24 1638 205 lb (93 kg)     Height --      Head Circumference --      Peak Flow --      Pain Score 06/13/24 1638 0     Pain Loc --      Pain Education --      Exclude from Growth Chart --    No data found.  Updated Vital Signs BP (!) 159/79 (BP Location: Left Arm)   Pulse 82   Temp 98.2 F (36.8 C) (Oral)   Resp 16   Wt 205 lb (93 kg)   SpO2 97%   Visual Acuity Right Eye Distance:   Left Eye Distance:   Bilateral Distance:    Right Eye Near:   Left Eye Near:    Bilateral Near:     Physical Exam Vitals and nursing note reviewed.  Constitutional:      General: He is not in acute distress.    Appearance: Normal appearance. He is  not ill-appearing.  HENT:     Head: Normocephalic and atraumatic.  Eyes:     Conjunctiva/sclera: Conjunctivae normal.  Cardiovascular:     Rate and Rhythm: Normal rate.  Pulmonary:     Effort: Pulmonary effort is normal. No respiratory distress.  Neurological:     Mental Status: He is alert.  Psychiatric:        Mood and Affect: Mood normal.        Behavior: Behavior normal.        Thought Content: Thought content normal.      UC Treatments / Results  Labs (all labs ordered are listed, but only abnormal results are displayed) Labs Reviewed  POCT URINE DIPSTICK  CYTOLOGY, (ORAL, ANAL, URETHRAL) ANCILLARY ONLY    EKG   Radiology No results found.  Procedures Procedures (including critical care time)  Medications Ordered in UC Medications - No data to display  Initial Impression / Assessment and Plan / UC Course  I have reviewed the triage vital signs and the nursing notes.  Pertinent labs & imaging results that were available during my care of the patient were reviewed by me and considered in my medical decision  making (see chart for details).     UA without findings consistent with UTI.  Will order STD screening as requested.  Will await results of further recommendation and advised abstinence while awaiting results.  Patient expressed understanding.   Final Clinical Impressions(s) / UC Diagnoses   Final diagnoses:  Dysuria  Screening for STD (sexually transmitted disease)   Discharge Instructions   None    ED Prescriptions   None    PDMP not reviewed this encounter.   Billy Asberry FALCON, PA-C 06/13/24 1744

## 2024-06-19 LAB — CYTOLOGY, (ORAL, ANAL, URETHRAL) ANCILLARY ONLY
Chlamydia: NEGATIVE
Comment: NEGATIVE
Comment: NEGATIVE
Comment: NORMAL
Neisseria Gonorrhea: NEGATIVE
Trichomonas: NEGATIVE

## 2024-06-21 ENCOUNTER — Other Ambulatory Visit: Payer: Self-pay | Admitting: Nurse Practitioner

## 2024-06-21 ENCOUNTER — Ambulatory Visit

## 2024-06-21 ENCOUNTER — Other Ambulatory Visit (INDEPENDENT_AMBULATORY_CARE_PROVIDER_SITE_OTHER): Payer: Self-pay

## 2024-06-21 DIAGNOSIS — K4090 Unilateral inguinal hernia, without obstruction or gangrene, not specified as recurrent: Secondary | ICD-10-CM

## 2024-06-21 DIAGNOSIS — R1030 Lower abdominal pain, unspecified: Secondary | ICD-10-CM

## 2024-06-22 ENCOUNTER — Ambulatory Visit (HOSPITAL_BASED_OUTPATIENT_CLINIC_OR_DEPARTMENT_OTHER)
Admission: RE | Admit: 2024-06-22 | Discharge: 2024-06-22 | Disposition: A | Discharge status: Home / Self Care | Source: Ambulatory Visit | Attending: Primary Care | Admitting: Primary Care

## 2024-06-22 DIAGNOSIS — K4090 Unilateral inguinal hernia, without obstruction or gangrene, not specified as recurrent: Secondary | ICD-10-CM | POA: Insufficient documentation

## 2024-06-22 DIAGNOSIS — R1031 Right lower quadrant pain: Secondary | ICD-10-CM | POA: Diagnosis not present

## 2024-06-24 ENCOUNTER — Ambulatory Visit
Admission: RE | Admit: 2024-06-24 | Discharge: 2024-06-24 | Disposition: A | Discharge status: Home / Self Care | Source: Ambulatory Visit | Attending: Nurse Practitioner | Admitting: Nurse Practitioner

## 2024-06-24 DIAGNOSIS — K4090 Unilateral inguinal hernia, without obstruction or gangrene, not specified as recurrent: Secondary | ICD-10-CM

## 2024-06-24 DIAGNOSIS — R1030 Lower abdominal pain, unspecified: Secondary | ICD-10-CM | POA: Diagnosis not present

## 2024-06-24 MED ORDER — IOPAMIDOL (ISOVUE-300) INJECTION 61%
100.0000 mL | Freq: Once | INTRAVENOUS | Status: AC | PRN
  Administered 2024-06-24: 100 mL via INTRAVENOUS

## 2024-09-09 DIAGNOSIS — R3911 Hesitancy of micturition: Secondary | ICD-10-CM | POA: Diagnosis not present

## 2024-09-09 DIAGNOSIS — Z113 Encounter for screening for infections with a predominantly sexual mode of transmission: Secondary | ICD-10-CM | POA: Diagnosis not present

## 2024-09-09 DIAGNOSIS — R350 Frequency of micturition: Secondary | ICD-10-CM | POA: Diagnosis not present

## 2024-09-24 ENCOUNTER — Encounter: Payer: Self-pay | Admitting: Nurse Practitioner
